# Patient Record
Sex: Female | Born: 1970 | Race: White | Hispanic: No | Marital: Married | Smoking: Never smoker
Health system: Southern US, Community
[De-identification: ages and names within clinical notes are randomized; demographics above are authoritative.]

---

## 2001-06-15 ENCOUNTER — Other Ambulatory Visit: Admission: RE | Admit: 2001-06-15 | Discharge: 2001-06-15 | Payer: Self-pay | Admitting: Obstetrics and Gynecology

## 2002-01-12 ENCOUNTER — Inpatient Hospital Stay (HOSPITAL_COMMUNITY): Admission: AD | Admit: 2002-01-12 | Discharge: 2002-01-12 | Payer: Self-pay | Admitting: Obstetrics and Gynecology

## 2002-01-26 ENCOUNTER — Inpatient Hospital Stay (HOSPITAL_COMMUNITY): Admission: AD | Admit: 2002-01-26 | Discharge: 2002-01-30 | Payer: Self-pay | Admitting: Obstetrics and Gynecology

## 2002-03-09 ENCOUNTER — Other Ambulatory Visit: Admission: RE | Admit: 2002-03-09 | Discharge: 2002-03-09 | Payer: Self-pay | Admitting: Obstetrics and Gynecology

## 2003-05-24 ENCOUNTER — Other Ambulatory Visit: Admission: RE | Admit: 2003-05-24 | Discharge: 2003-05-24 | Payer: Self-pay | Admitting: Obstetrics and Gynecology

## 2003-11-17 ENCOUNTER — Inpatient Hospital Stay (HOSPITAL_COMMUNITY): Admission: RE | Admit: 2003-11-17 | Discharge: 2003-11-19 | Payer: Self-pay | Admitting: Obstetrics and Gynecology

## 2004-01-26 ENCOUNTER — Emergency Department (HOSPITAL_COMMUNITY): Admission: EM | Admit: 2004-01-26 | Discharge: 2004-01-26 | Payer: Self-pay | Admitting: Emergency Medicine

## 2004-10-31 ENCOUNTER — Inpatient Hospital Stay: Payer: Self-pay | Admitting: Psychiatry

## 2010-12-03 ENCOUNTER — Encounter (HOSPITAL_COMMUNITY): Payer: Self-pay | Admitting: *Deleted

## 2010-12-13 ENCOUNTER — Other Ambulatory Visit: Payer: Self-pay | Admitting: Obstetrics and Gynecology

## 2010-12-13 ENCOUNTER — Encounter (HOSPITAL_COMMUNITY): Payer: Self-pay | Admitting: Obstetrics and Gynecology

## 2010-12-13 ENCOUNTER — Encounter (HOSPITAL_COMMUNITY)
Admission: RE | Disposition: A | Discharge status: Home / Self Care | Payer: Self-pay | Source: Ambulatory Visit | Attending: Obstetrics and Gynecology

## 2010-12-13 ENCOUNTER — Ambulatory Visit (HOSPITAL_COMMUNITY)
Admission: RE | Admit: 2010-12-13 | Discharge: 2010-12-13 | Disposition: A | Discharge status: Home / Self Care | Payer: BC Managed Care – PPO | Source: Ambulatory Visit | Attending: Obstetrics and Gynecology | Admitting: Obstetrics and Gynecology

## 2010-12-13 ENCOUNTER — Ambulatory Visit (HOSPITAL_COMMUNITY): Payer: BC Managed Care – PPO | Admitting: Anesthesiology

## 2010-12-13 ENCOUNTER — Encounter (HOSPITAL_COMMUNITY): Payer: Self-pay | Admitting: Anesthesiology

## 2010-12-13 DIAGNOSIS — N938 Other specified abnormal uterine and vaginal bleeding: Secondary | ICD-10-CM | POA: Insufficient documentation

## 2010-12-13 DIAGNOSIS — N949 Unspecified condition associated with female genital organs and menstrual cycle: Secondary | ICD-10-CM | POA: Insufficient documentation

## 2010-12-13 DIAGNOSIS — N939 Abnormal uterine and vaginal bleeding, unspecified: Secondary | ICD-10-CM | POA: Diagnosis present

## 2010-12-13 LAB — CBC
Hemoglobin: 13.8 g/dL (ref 12.0–15.0)
MCH: 30.8 pg (ref 26.0–34.0)
MCHC: 34.4 g/dL (ref 30.0–36.0)
RDW: 13.6 % (ref 11.5–15.5)

## 2010-12-13 SURGERY — DILATATION & CURETTAGE/HYSTEROSCOPY WITH RESECTOCOPE
Anesthesia: General | Site: Vagina | Wound class: Clean Contaminated

## 2010-12-13 MED ORDER — LIDOCAINE HCL (CARDIAC) 20 MG/ML IV SOLN
INTRAVENOUS | Status: DC | PRN
  Administered 2010-12-13: 80 mg via INTRAVENOUS

## 2010-12-13 MED ORDER — FENTANYL CITRATE 0.05 MG/ML IJ SOLN: 25.0000 ug | INTRAMUSCULAR | Status: DC | PRN

## 2010-12-13 MED ORDER — FENTANYL CITRATE 0.05 MG/ML IJ SOLN
INTRAMUSCULAR | Status: AC
  Filled 2010-12-13: qty 2

## 2010-12-13 MED ORDER — KETOROLAC TROMETHAMINE 30 MG/ML IJ SOLN
INTRAMUSCULAR | Status: AC
  Filled 2010-12-13: qty 1

## 2010-12-13 MED ORDER — LIDOCAINE HCL (CARDIAC) 20 MG/ML IV SOLN
INTRAVENOUS | Status: AC
  Filled 2010-12-13: qty 5

## 2010-12-13 MED ORDER — MIDAZOLAM HCL 5 MG/5ML IJ SOLN
INTRAMUSCULAR | Status: DC | PRN
  Administered 2010-12-13: 2 mg via INTRAVENOUS

## 2010-12-13 MED ORDER — KETOROLAC TROMETHAMINE 60 MG/2ML IM SOLN
INTRAMUSCULAR | Status: AC
  Filled 2010-12-13: qty 2

## 2010-12-13 MED ORDER — ALBUTEROL SULFATE HFA 108 (90 BASE) MCG/ACT IN AERS
INHALATION_SPRAY | RESPIRATORY_TRACT | Status: DC | PRN
  Administered 2010-12-13: 2 via RESPIRATORY_TRACT

## 2010-12-13 MED ORDER — CHLOROPROCAINE HCL 1 % IJ SOLN
INTRAMUSCULAR | Status: DC | PRN
  Administered 2010-12-13: 10 mL

## 2010-12-13 MED ORDER — KETOROLAC TROMETHAMINE 60 MG/2ML IM SOLN
INTRAMUSCULAR | Status: DC | PRN
  Administered 2010-12-13: 30 mg via INTRAMUSCULAR

## 2010-12-13 MED ORDER — MIDAZOLAM HCL 2 MG/2ML IJ SOLN
INTRAMUSCULAR | Status: AC
  Filled 2010-12-13: qty 2

## 2010-12-13 MED ORDER — METOCLOPRAMIDE HCL 10 MG PO TABS: 10.0000 mg | ORAL_TABLET | Freq: Once | ORAL | Status: DC | PRN

## 2010-12-13 MED ORDER — LACTATED RINGERS IV SOLN
INTRAVENOUS | Status: DC
  Administered 2010-12-13: 14:00:00 via INTRAVENOUS

## 2010-12-13 MED ORDER — SCOPOLAMINE 1 MG/3DAYS TD PT72: 1.0000 | MEDICATED_PATCH | Freq: Once | TRANSDERMAL | Status: DC | PRN

## 2010-12-13 MED ORDER — ONDANSETRON HCL 4 MG/2ML IJ SOLN: 4.0000 mg | Freq: Once | INTRAMUSCULAR | Status: DC | PRN

## 2010-12-13 MED ORDER — ONDANSETRON HCL 4 MG/2ML IJ SOLN
INTRAMUSCULAR | Status: AC
  Filled 2010-12-13: qty 2

## 2010-12-13 MED ORDER — GLYCINE 1.5 % IR SOLN
Status: DC | PRN
  Administered 2010-12-13: 1

## 2010-12-13 MED ORDER — PROPOFOL 10 MG/ML IV EMUL
INTRAVENOUS | Status: DC | PRN
  Administered 2010-12-13: 200 mg via INTRAVENOUS

## 2010-12-13 MED ORDER — FAMOTIDINE 20 MG PO TABS: 20.0000 mg | ORAL_TABLET | Freq: Once | ORAL | Status: DC | PRN

## 2010-12-13 MED ORDER — MEPERIDINE HCL 25 MG/ML IJ SOLN
6.2500 mg | INTRAMUSCULAR | Status: DC | PRN
Start: 2010-12-13 — End: 2010-12-13

## 2010-12-13 MED ORDER — KETOROLAC TROMETHAMINE 30 MG/ML IJ SOLN: 15.0000 mg | Freq: Once | INTRAMUSCULAR | Status: DC | PRN

## 2010-12-13 MED ORDER — ALBUTEROL SULFATE HFA 108 (90 BASE) MCG/ACT IN AERS
INHALATION_SPRAY | RESPIRATORY_TRACT | Status: AC
Start: 2010-12-13 — End: 2010-12-13
  Filled 2010-12-13: qty 6.7

## 2010-12-13 MED ORDER — ONDANSETRON HCL 4 MG/2ML IJ SOLN
INTRAMUSCULAR | Status: DC | PRN
  Administered 2010-12-13: 4 mg via INTRAVENOUS

## 2010-12-13 MED ORDER — MORPHINE SULFATE 0.5 MG/ML IJ SOLN
INTRAMUSCULAR | Status: AC
  Filled 2010-12-13: qty 10

## 2010-12-13 MED ORDER — PANTOPRAZOLE SODIUM 40 MG PO TBEC: 40.0000 mg | DELAYED_RELEASE_TABLET | Freq: Once | ORAL | Status: DC | PRN

## 2010-12-13 MED ORDER — KETOROLAC TROMETHAMINE 30 MG/ML IJ SOLN
INTRAMUSCULAR | Status: DC | PRN
  Administered 2010-12-13: 30 mg via INTRAVENOUS

## 2010-12-13 MED ORDER — FENTANYL CITRATE 0.05 MG/ML IJ SOLN
INTRAMUSCULAR | Status: DC | PRN
  Administered 2010-12-13: 100 ug via INTRAVENOUS

## 2010-12-13 MED ORDER — PROPOFOL 10 MG/ML IV EMUL
INTRAVENOUS | Status: AC
  Filled 2010-12-13: qty 20

## 2010-12-13 MED ORDER — CITRIC ACID-SODIUM CITRATE 334-500 MG/5ML PO SOLN: 30.0000 mL | Freq: Once | ORAL | Status: DC | PRN

## 2010-12-13 MED ORDER — FENTANYL CITRATE 0.05 MG/ML IJ SOLN
INTRAMUSCULAR | Status: AC
  Filled 2010-12-13: qty 5

## 2010-12-13 MED ORDER — LACTATED RINGERS IV SOLN
INTRAVENOUS | Status: DC | PRN
  Administered 2010-12-13: 15:00:00 via INTRAVENOUS

## 2010-12-13 SURGICAL SUPPLY — 19 items
CANISTER SUCTION 2500CC (MISCELLANEOUS) ×2 IMPLANT
CATH ROBINSON RED A/P 16FR (CATHETERS) ×2 IMPLANT
CLOTH BEACON ORANGE TIMEOUT ST (SAFETY) ×2 IMPLANT
CONTAINER PREFILL 10% NBF 60ML (FORM) ×4 IMPLANT
DRAPE UTILITY XL STRL (DRAPES) ×4 IMPLANT
ELECT REM PT RETURN 9FT ADLT (ELECTROSURGICAL) ×2
ELECTRODE REM PT RTRN 9FT ADLT (ELECTROSURGICAL) ×1 IMPLANT
ELECTRODE ROLLER BARREL 22FR (ELECTROSURGICAL) IMPLANT
ELECTRODE ROLLER VERSAPOINT (ELECTRODE) IMPLANT
ELECTRODE RT ANGLE VERSAPOINT (CUTTING LOOP) IMPLANT
ELECTRODE VAPORCUT 22FR (ELECTROSURGICAL) IMPLANT
GLOVE BIO SURGEON STRL SZ 6.5 (GLOVE) ×2 IMPLANT
GLOVE BIOGEL PI IND STRL 7.0 (GLOVE) ×2 IMPLANT
GLOVE BIOGEL PI INDICATOR 7.0 (GLOVE) ×2
GOWN PREVENTION PLUS LG XLONG (DISPOSABLE) ×4 IMPLANT
LOOP ANGLED CUTTING 22FR (CUTTING LOOP) IMPLANT
PACK HYSTEROSCOPY LF (CUSTOM PROCEDURE TRAY) ×2 IMPLANT
TOWEL OR 17X24 6PK STRL BLUE (TOWEL DISPOSABLE) ×4 IMPLANT
WATER STERILE IRR 1000ML POUR (IV SOLUTION) ×2 IMPLANT

## 2010-12-13 NOTE — Transfer of Care (Signed)
Immediate Anesthesia Transfer of Care Note  Patient: Sarah Dixon  Procedure(s) Performed:  DILATATION & CURETTAGE/HYSTEROSCOPY WITH RESECTOSCOPE - Dilation and Curettage with Hysteroscopy  Patient Location: PACU  Anesthesia Type: General  Level of Consciousness: awake, alert  and oriented  Airway & Oxygen Therapy: Patient Spontanous Breathing and Patient connected to nasal cannula oxygen  Post-op Assessment: Report given to PACU RN  Post vital signs: stable  Complications: No apparent anesthesia complications

## 2010-12-13 NOTE — Anesthesia Preprocedure Evaluation (Addendum)
Anesthesia Evaluation  Name, MR# and DOB Patient awake  General Assessment Comment  Reviewed: Allergy & Precautions, H&P , NPO status , Patient's Chart, lab work & pertinent test results, reviewed documented beta blocker date and time   Airway Mallampati: II TM Distance: >3 FB Neck ROM: full    Dental  (+) Teeth Intact   Pulmonary  asthma (allergy related, no inhaler use x2 months)  clear to auscultation  breath sounds clear to auscultation none    Cardiovascular Exercise Tolerance: Good regular Normal    Neuro/Psych Negative Neurological ROS  Negative Psych ROS  GI/Hepatic/Renal negative GI ROS, negative Liver ROS, and negative Renal ROS (+)       Endo/Other  Negative Endocrine ROS (+)      Abdominal   Musculoskeletal   Hematology negative hematology ROS (+)   Peds  Reproductive/Obstetrics negative OB ROS    Anesthesia Other Findings             Anesthesia Physical Anesthesia Plan  ASA: II  Anesthesia Plan: General   Post-op Pain Management:    Induction:   Airway Management Planned:   Additional Equipment:   Intra-op Plan:   Post-operative Plan:   Informed Consent: I have reviewed the patients History and Physical, chart, labs and discussed the procedure including the risks, benefits and alternatives for the proposed anesthesia with the patient or authorized representative who has indicated his/her understanding and acceptance.   Dental Advisory Given  Plan Discussed with: CRNA and Surgeon  Anesthesia Plan Comments: (ASA 2 secondary to asthma)       Anesthesia Quick Evaluation

## 2010-12-13 NOTE — Anesthesia Postprocedure Evaluation (Signed)
Anesthesia Post Note  Patient: Sarah Dixon  Procedure(s) Performed:  DILATATION & CURETTAGE/HYSTEROSCOPY WITH RESECTOSCOPE - Dilation and Curettage with Hysteroscopy  Anesthesia type: General  Patient location: PACU  Post pain: Pain level controlled  Post assessment: Post-op Vital signs reviewed  Last Vitals:  Filed Vitals:   12/13/10 1359  BP: 110/70  Pulse: 71  Temp: 98.8 F (37.1 C)  Resp: 16    Post vital signs: Reviewed  Level of consciousness: sedated  Complications: No apparent anesthesia complicationsfj

## 2010-12-13 NOTE — Brief Op Note (Signed)
12/13/2010  3:59 PM  PATIENT:  Sarah Dixon  40 y.o. female  PRE-OPERATIVE DIAGNOSIS:  Dysfunctional Uterine Bleeding, Endometrial Mass  POST-OPERATIVE DIAGNOSIS:  Dysfunctional Uterine Bleeding; Endometrial Mass  PROCEDURE:  Procedure(s): DILATATION & CURETTAGE/HYSTEROSCOPY diagnostic  SURGEON:  Surgeon(s): Borden Thune Cathie Beams, MD  PHYSICIAN ASSISTANT:   ASSISTANTS: none  ANESTHESIA:   general and paracervical block  ESTIMATED BLOOD LOSS: * No blood loss amount entered *   BLOOD ADMINISTERED:none  DRAINS: none   LOCAL MEDICATIONS USED:  OTHER nesicaine  SPECIMEN:  Source of Specimen:  endometrial currettings  DISPOSITION OF SPECIMEN:  PATHOLOGY  COUNTS:  YES  TOURNIQUET:  * No tourniquets in log *  DICTATION #: [id number]  PLAN OF CARE: home  PATIENT DISPOSITION:  home   Delay start of Pharmacological VTE agent (>24hrs) due to surgical blood loss or risk of bleeding:  no

## 2010-12-14 NOTE — Op Note (Signed)
Sarah Dixon, Sarah Dixon           ACCOUNT NO.:  [id number]  MEDICAL RECORD NO.:  [id number]  LOCATION:  [facility]                          FACILITY:  [facility]  PHYSICIAN:  Maxie Better, M.D.DATE OF BIRTH:  [birth year]  DATE OF PROCEDURE:  12/13/2010 DATE OF DISCHARGE:  12/13/2010                              OPERATIVE REPORT   PREOPERATIVE DIAGNOSES:  Dysfunctional uterine bleeding, endometrial mass.  POSTOPERATIVE DIAGNOSIS:  Dysfunctional uterine bleeding.  PROCEDURE:  Diagnostic hysteroscopy, dilation, curettage.  ANESTHESIA:  General, paracervical block.  SURGEON:  Maxie Better, MD  ASSISTANT:  None.  PROCEDURE:  Under adequate general anesthesia, the patient was placed in the dorsal lithotomy position.  She was sterilely prepped and draped in usual fashion.  Bladder was catheterized with moderate amount of urine. Examination under anesthesia revealed anteverted uterus.  No adnexal masses could be appreciated.  Bivalve speculum was placed in the vagina, 10 mL of 1% Nesacaine was injected paracervically at 3 and 9 o'clock positions.  The anterior lip of the cervix was grasped with a single- tooth tenaculum.  The cervix was then serially dilated to [facility] dilator.  A glycine primed diagnostic hysteroscope was introduced into the uterine cavity.  Both tubal ostia were seen well.  Posterior thickening was noted, but no defined endometrial polyp initially seen. The hysteroscope was removed.  The cavity was then curetted.  The cystoscope was then reinserted.  A polypoid lesion was noted in the left lateral wall.  The hysteroscope was then removed.  The cavity was again curetted.  Reinsertion of the hysteroscope no other lesions were noted. The endocervical canal was without any lesions.  The procedure was felt to be complete by terminating all instruments from the vagina.  SPECIMENS:  Endometrial curetting sent to Pathology.  ESTIMATED BLOOD LOSS:   Minimal.  COMPLICATION:  None.  FLUID DEFICIT:  65 mL.  The patient tolerated the procedure well and was transferred to recovery room in stable condition.     Maxie Better, M.D      [facility]  D:  12/13/2010  T:  12/14/2010  Job:  [id number]

## 2017-01-28 ENCOUNTER — Encounter: Payer: Self-pay | Admitting: Emergency Medicine

## 2017-01-28 ENCOUNTER — Emergency Department
Admission: EM | Admit: 2017-01-28 | Discharge: 2017-01-29 | Disposition: A | Discharge status: Other Acute Inpatient Hospital | Payer: BLUE CROSS/BLUE SHIELD | Attending: Emergency Medicine | Admitting: Emergency Medicine

## 2017-01-28 ENCOUNTER — Emergency Department: Payer: BLUE CROSS/BLUE SHIELD

## 2017-01-28 DIAGNOSIS — J45909 Unspecified asthma, uncomplicated: Secondary | ICD-10-CM | POA: Diagnosis not present

## 2017-01-28 DIAGNOSIS — T438X2A Poisoning by other psychotropic drugs, intentional self-harm, initial encounter: Secondary | ICD-10-CM | POA: Diagnosis not present

## 2017-01-28 DIAGNOSIS — T50901A Poisoning by unspecified drugs, medicaments and biological substances, accidental (unintentional), initial encounter: Secondary | ICD-10-CM

## 2017-01-28 DIAGNOSIS — F419 Anxiety disorder, unspecified: Secondary | ICD-10-CM | POA: Diagnosis not present

## 2017-01-28 DIAGNOSIS — F3113 Bipolar disorder, current episode manic without psychotic features, severe: Secondary | ICD-10-CM | POA: Diagnosis not present

## 2017-01-28 DIAGNOSIS — T56891A Toxic effect of other metals, accidental (unintentional), initial encounter: Secondary | ICD-10-CM

## 2017-01-28 DIAGNOSIS — R42 Dizziness and giddiness: Secondary | ICD-10-CM | POA: Diagnosis present

## 2017-01-28 DIAGNOSIS — F319 Bipolar disorder, unspecified: Secondary | ICD-10-CM | POA: Diagnosis not present

## 2017-01-28 DIAGNOSIS — F3110 Bipolar disorder, current episode manic without psychotic features, unspecified: Secondary | ICD-10-CM

## 2017-01-28 DIAGNOSIS — T50902A Poisoning by unspecified drugs, medicaments and biological substances, intentional self-harm, initial encounter: Secondary | ICD-10-CM

## 2017-01-28 LAB — URINE DRUG SCREEN, QUALITATIVE (ARMC ONLY)
AMPHETAMINES, UR SCREEN: NOT DETECTED
BENZODIAZEPINE, UR SCRN: NOT DETECTED
Barbiturates, Ur Screen: NOT DETECTED
Cannabinoid 50 Ng, Ur ~~LOC~~: NOT DETECTED
Cocaine Metabolite,Ur ~~LOC~~: NOT DETECTED
MDMA (ECSTASY) UR SCREEN: NOT DETECTED
METHADONE SCREEN, URINE: NOT DETECTED
Opiate, Ur Screen: NOT DETECTED
PHENCYCLIDINE (PCP) UR S: NOT DETECTED
TRICYCLIC, UR SCREEN: NOT DETECTED

## 2017-01-28 LAB — HEPATIC FUNCTION PANEL
ALBUMIN: 4.6 g/dL (ref 3.5–5.0)
ALT: 15 U/L (ref 14–54)
AST: 19 U/L (ref 15–41)
Alkaline Phosphatase: 57 U/L (ref 38–126)
BILIRUBIN INDIRECT: 0.8 mg/dL (ref 0.3–0.9)
Bilirubin, Direct: 0.2 mg/dL (ref 0.1–0.5)
TOTAL PROTEIN: 8.4 g/dL — AB (ref 6.5–8.1)
Total Bilirubin: 1 mg/dL (ref 0.3–1.2)

## 2017-01-28 LAB — BASIC METABOLIC PANEL
Anion gap: 9 (ref 5–15)
BUN: 11 mg/dL (ref 6–20)
CALCIUM: 9.7 mg/dL (ref 8.9–10.3)
CO2: 26 mmol/L (ref 22–32)
CREATININE: 0.7 mg/dL (ref 0.44–1.00)
Chloride: 102 mmol/L (ref 101–111)
Glucose, Bld: 100 mg/dL — ABNORMAL HIGH (ref 65–99)
Potassium: 3.8 mmol/L (ref 3.5–5.1)
Sodium: 137 mmol/L (ref 135–145)

## 2017-01-28 LAB — CBC WITH DIFFERENTIAL/PLATELET
Basophils Absolute: 0 10*3/uL (ref 0–0.1)
Basophils Relative: 0 %
EOS ABS: 0 10*3/uL (ref 0–0.7)
Eosinophils Relative: 0 %
HCT: 47.3 % — ABNORMAL HIGH (ref 35.0–47.0)
Hemoglobin: 16.1 g/dL — ABNORMAL HIGH (ref 12.0–16.0)
LYMPHS ABS: 1.1 10*3/uL (ref 1.0–3.6)
Lymphocytes Relative: 8 %
MCH: 30.4 pg (ref 26.0–34.0)
MCHC: 34 g/dL (ref 32.0–36.0)
MCV: 89.2 fL (ref 80.0–100.0)
MONO ABS: 0.7 10*3/uL (ref 0.2–0.9)
MONOS PCT: 5 %
Neutro Abs: 11.8 10*3/uL — ABNORMAL HIGH (ref 1.4–6.5)
Neutrophils Relative %: 87 %
Platelets: 284 10*3/uL (ref 150–440)
RBC: 5.3 MIL/uL — ABNORMAL HIGH (ref 3.80–5.20)
RDW: 13.3 % (ref 11.5–14.5)
WBC: 13.6 10*3/uL — ABNORMAL HIGH (ref 3.6–11.0)

## 2017-01-28 LAB — ACETAMINOPHEN LEVEL

## 2017-01-28 LAB — LITHIUM LEVEL
LITHIUM LVL: 2.1 mmol/L — AB (ref 0.60–1.20)
Lithium Lvl: 2.7 mmol/L (ref 0.60–1.20)

## 2017-01-28 LAB — POCT PREGNANCY, URINE: PREG TEST UR: NEGATIVE

## 2017-01-28 LAB — LIPASE, BLOOD: LIPASE: 22 U/L (ref 11–51)

## 2017-01-28 LAB — LACTIC ACID, PLASMA: LACTIC ACID, VENOUS: 0.9 mmol/L (ref 0.5–1.9)

## 2017-01-28 LAB — ETHANOL

## 2017-01-28 LAB — TSH: TSH: 2.947 u[IU]/mL (ref 0.350–4.500)

## 2017-01-28 LAB — SALICYLATE LEVEL: Salicylate Lvl: <7 mg/dL (ref 2.8–30.0)

## 2017-01-28 MED ORDER — SODIUM CHLORIDE 0.9 % IV BOLUS (SEPSIS)
1000.0000 mL | Freq: Once | INTRAVENOUS | Status: AC
Start: 2017-01-28 — End: 2017-01-29
  Administered 2017-01-28: 1000 mL via INTRAVENOUS

## 2017-01-28 MED ORDER — ONDANSETRON HCL 4 MG/2ML IJ SOLN
INTRAMUSCULAR | Status: AC
  Filled 2017-01-28: qty 2

## 2017-01-28 MED ORDER — SODIUM CHLORIDE 0.9 % IV BOLUS (SEPSIS)
2000.0000 mL | Freq: Once | INTRAVENOUS | Status: AC
  Administered 2017-01-28: 2000 mL via INTRAVENOUS

## 2017-01-28 MED ORDER — POLYETHYLENE GLYCOL 3350 17 G PO PACK
PACK | ORAL | Status: AC
  Filled 2017-01-28: qty 1

## 2017-01-28 MED ORDER — POLYETHYLENE GLYCOL 3350 17 GM/SCOOP PO POWD
1.0000 | Freq: Once | ORAL | Status: AC
  Administered 2017-01-28: 255 g via ORAL
  Filled 2017-01-28: qty 255

## 2017-01-28 MED ORDER — ONDANSETRON HCL 4 MG/2ML IJ SOLN
4.0000 mg | Freq: Once | INTRAMUSCULAR | Status: AC
  Administered 2017-01-28: 4 mg via INTRAVENOUS

## 2017-01-28 NOTE — ED Notes (Addendum)
Husband called voicing concerns for decreased supervision, requesting implementation 1:1 again, charge to speak with family at this time.

## 2017-01-28 NOTE — ED Notes (Signed)
Lab called about lithium level not ran yet. Reported they will run level now

## 2017-01-28 NOTE — ED Notes (Signed)
Husband took home wedding band and engagement ring

## 2017-01-28 NOTE — ED Notes (Addendum)
Pt moved to ED room 26. Per Scotty Court, MD, safety precaution initiated with ED rover in place with q15 min checks as well as assigned RN, Revonda Standard.

## 2017-01-28 NOTE — ED Provider Notes (Signed)
[facility] Emergency Department Provider Note  ____________________________________________  Time seen: Approximately 4:18 PM  I have reviewed the triage vital signs and the nursing notes.   HISTORY  Chief Complaint Drug Overdose  Level 5 Caveat: Portions of the History and Physical are unable to be obtained due to patient being a poor historian additional history obtained from husband and mother at bedside  HPI Sarah Dixon is a 46 y.o. female brought to the ED by her mother and husband due to a lithium overdose. The patient was started on lithium for anxiety in the setting of her bipolar disorder 3 days ago. She's been taking it as prescribed, 2 times a day up until mid day today, when out of frustration over her inability to sleep, she took the rest of the bottle. She denies SI/HI/hallucinations. Husband states this amounts to 57 tablets of 300 mg lithium carbonate. I'm unable to discern whether this is immediate release or sustained release.ingestion was estimated to take place around 11:00 AM or noon today.patient denies any coingestants.  Patient reports feeling dizzy, tired, and "foggy". Denies any pain, no abdominal pain vomiting or diarrhea. She does have nausea.     Past Medical History:  Diagnosis Date  . Asthma    exercise induced     Patient Active Problem List   Diagnosis Date Noted  . Abnormal vaginal bleeding 12/13/2010     Past Surgical History:  Procedure Laterality Date  . CESAREAN SECTION     x 2     Prior to Admission medications   Medication Sig Start Date End Date Taking? Authorizing Provider  aspirin-acetaminophen-caffeine (EXCEDRIN MIGRAINE) [phone] MG per tablet Take 2 tablets by mouth as needed. For headache pain    Yes [provider]  ibuprofen (ADVIL,MOTRIN) 200 MG tablet Take 600 mg by mouth as needed. For pain    Yes [provider]  lithium 300 MG tablet Take 300 mg by mouth 2 (two)  times daily. 01/26/17  Yes [provider]     Allergies Iodine   No family history on file.  Social History Social History  Substance Use Topics  . Smoking status: Never Smoker  . Smokeless tobacco: Not on file  . Alcohol use 1.2 oz/week    2 Glasses of wine per week    Review of Systems  Constitutional:   No fever or chills.  ENT:   No sore throat. No rhinorrhea. Cardiovascular:   No chest pain or syncope. Respiratory:   No dyspnea or cough. Gastrointestinal:   Negative for abdominal pain, vomiting and diarrhea.  Musculoskeletal:   Negative for focal pain or swelling All other systems reviewed and are negative except as documented above in ROS and HPI.  ____________________________________________   PHYSICAL EXAM:  VITAL SIGNS: ED Triage Vitals [01/28/17 1505]  Enc Vitals Group     BP 139/85     Pulse Rate 74     Resp 18     Temp 98 F (36.7 C)     Temp Source Oral     SpO2 97 %     Weight 170 lb (77.1 kg)     Height  (1.753 m)     Head Circumference      Peak Flow      Pain Score      Pain Loc      Pain Edu?      Excl. in GC?     Vital signs reviewed, nursing assessments reviewed.  Constitutional:   Alert and oriented.not in distress.somewhat drowsy appearing Eyes:   No scleral icterus.  EOMI. No nystagmus. No conjunctival pallor. PERRL. ENT   Head:   Normocephalic and atraumatic.   Nose:   No congestion/rhinnorhea.    Mouth/Throat:   dry mucous membranes, no pharyngeal erythema. No peritonsillar mass.    Neck:   No meningismus. Full ROM Hematological/Lymphatic/Immunilogical:   No cervical lymphadenopathy. Cardiovascular:   RRR. Symmetric bilateral radial and DP pulses.  No murmurs.  Respiratory:   Normal respiratory effort without tachypnea/retractions. Breath sounds are clear and equal bilaterally. No wheezes/rales/rhonchi. Gastrointestinal:   Soft and nontender. Non distended. There is no CVA tenderness.  No rebound,  rigidity, or guarding. Genitourinary:   deferred Musculoskeletal:   Normal range of motion in all extremities. No joint effusions.  No lower extremity tenderness.  No edema. Neurologic:   Normal speech and language.  Motor grossly intact. No gross focal neurologic deficits are appreciated.  Skin:    Skin is warm, dry and intact. No rash noted.  No petechiae, purpura, or bullae.  ____________________________________________    LABS (pertinent positives/negatives) (all labs ordered are listed, but only abnormal results are displayed) Labs Reviewed  ACETAMINOPHEN LEVEL - Abnormal; Notable for the following:       Result Value   Acetaminophen (Tylenol), Serum <10 (*)    All other components within normal limits  CBC WITH DIFFERENTIAL/PLATELET - Abnormal; Notable for the following:    WBC 13.6 (*)    RBC 5.30 (*)    Hemoglobin 16.1 (*)    HCT 47.3 (*)    Neutro Abs 11.8 (*)    All other components within normal limits  BASIC METABOLIC PANEL - Abnormal; Notable for the following:    Glucose, Bld 100 (*)    All other components within normal limits  HEPATIC FUNCTION PANEL - Abnormal; Notable for the following:    Total Protein 8.4 (*)    All other components within normal limits  LITHIUM LEVEL - Abnormal; Notable for the following:    Lithium Lvl 2.70 (*)    All other components within normal limits  ETHANOL  LIPASE, BLOOD  LACTIC ACID, PLASMA  SALICYLATE LEVEL  TSH  URINE DRUG SCREEN, QUALITATIVE ([facility] ONLY)  URINALYSIS, COMPLETE (UACMP) WITH MICROSCOPIC  LITHIUM LEVEL  POC URINE PREG, ED  POCT PREGNANCY, URINE   ____________________________________________   EKG  interpreted by me  Date: 01/28/2017  Rate: 67  Rhythm: normal sinus rhythm  QRS Axis: normal  Intervals: normal  ST/T Wave abnormalities: normal  Conduction Disutrbances: none  Narrative Interpretation: unremarkable      ____________________________________________    RADIOLOGY  Dg Abd 1  View  Result Date: 01/28/2017 CLINICAL DATA:  Overdose. EXAM: ABDOMEN - 1 VIEW COMPARISON:  None. FINDINGS: Paucity of bowel gas. No radio-opaque calculi or other significant radiographic abnormality are seen. IMPRESSION: Negative. Electronically Signed   By: Obie Dredge M.D.   On: 01/28/2017 20:05    ____________________________________________   PROCEDURES Procedures CRITICAL CARE Performed by: Scotty Court, Vendetta Pittinger   Total critical care time: 35 minutes  Critical care time was exclusive of separately billable procedures and treating other patients.  Critical care was necessary to treat or prevent imminent or life-threatening deterioration.  Critical care was time spent personally by me on the following activities: development of treatment plan with patient and/or surrogate as well as nursing, discussions with consultants, evaluation of patient's response to treatment, examination of patient, obtaining history from patient or  surrogate, ordering and performing treatments and interventions, ordering and review of laboratory studies, ordering and review of radiographic studies, pulse oximetry and re-evaluation of patient's condition.  ____________________________________________   INITIAL IMPRESSION / ASSESSMENT AND PLAN / ED COURSE  Pertinent labs & imaging results that were available during my care of the patient were reviewed by me and considered in my medical decision making (see chart for details).  On presentation, pt has good support, appears to be lucid and with MDM capacity and not a danger to herself or others, does not meet criteria for commitment at this time.  Clinical Course as of Jan 28 2134  Wed Jan 28, 2017  1550 Pt p/w lithium overdose of 17 grams PO. Unclear if IR or ER tablets of lithium carbonate. Will give WBI solution at high dose in attempt to minimize any remaining absorption. Check labs. Hydrate aggressively.   [PS]  1921 Lithium level still pending  [PS]   1956 Still waiting for lithium level. According to RN, there was a 2-3 hour delay before lab even started running the test.   [PS]  2131 No severe toxicity sx. Level not indicative of emergent HD. Will check repeat lithium. Family now states they believe pt was trying to kill herself. IVC'd. Psych consult. VSS.  Lithium: (!!) 2.70 [PS]    Clinical Course User Index [PS] Sharman Cheek, MD    ----------------------------------------- 9:35 PM on 01/28/2017 -----------------------------------------  Patient remains medically stable. Serial lithium level to evaluate for any interval absorption. No clinical evidence of significant toxicity other than mild lethargy and slurred speech. Plan for psychiatry evaluation.   ____________________________________________   FINAL CLINICAL IMPRESSION(S) / ED DIAGNOSES  Final diagnoses:  Overdose  Intentional drug overdose, initial encounter [facility])      New Prescriptions   No medications on file     Portions of this note were generated with dragon dictation software. Dictation errors may occur despite best attempts at proofreading.    Sharman Cheek, MD 01/28/17 2136

## 2017-01-28 NOTE — ED Notes (Signed)
Date and time results received: 01/28/17 2217  Test: Lithium Critical Value: 2.10  Name of Provider Notified: dr. Scotty Court

## 2017-01-28 NOTE — ED Notes (Signed)
Hooked patient back up to monitor. 

## 2017-01-28 NOTE — ED Notes (Signed)
Red top, lavendar and light green tube sent to lab.

## 2017-01-28 NOTE — ED Notes (Signed)
Helped patient to go to bathroom.

## 2017-01-28 NOTE — ED Notes (Signed)
Date and time results received: 01/28/17 2046  Test: lithium Critical Value: 2.70  Name of Provider Notified: Dr Scotty Court made aware

## 2017-01-28 NOTE — ED Notes (Signed)
Husband Chrissie Noa) [phone]

## 2017-01-28 NOTE — ED Notes (Signed)
Pharmacy called to send up miralax 255 g

## 2017-01-28 NOTE — ED Triage Notes (Addendum)
Pt presents with husband and mother. Pt reports taking 57 300 mg lithium tablets in an attempt to sleep. Pt husband reports pt started lithium on Monday for treatment of possible bipolar disorder. Pt had not been on medication for bipolar on a daily basis but since lithium has helped in the past this was prescribed for her. Pt family reports pt sleeps only a couple of hours each night. Pt denies SI in triage. Pt alert in triage but appears drowsy. Pt reports taking the pills sometime between 10a and 1p.

## 2017-01-29 ENCOUNTER — Inpatient Hospital Stay
Admission: AD | Admit: 2017-01-29 | Discharge: 2017-02-06 | DRG: 885 | Disposition: A | Discharge status: Home / Self Care | Payer: BLUE CROSS/BLUE SHIELD | Attending: Psychiatry | Admitting: Psychiatry

## 2017-01-29 ENCOUNTER — Encounter: Payer: Self-pay | Admitting: Psychiatry

## 2017-01-29 DIAGNOSIS — Z888 Allergy status to other drugs, medicaments and biological substances status: Secondary | ICD-10-CM | POA: Diagnosis not present

## 2017-01-29 DIAGNOSIS — F319 Bipolar disorder, unspecified: Principal | ICD-10-CM | POA: Diagnosis present

## 2017-01-29 DIAGNOSIS — G4700 Insomnia, unspecified: Secondary | ICD-10-CM | POA: Diagnosis present

## 2017-01-29 DIAGNOSIS — R45851 Suicidal ideations: Secondary | ICD-10-CM | POA: Diagnosis present

## 2017-01-29 DIAGNOSIS — F419 Anxiety disorder, unspecified: Secondary | ICD-10-CM | POA: Diagnosis present

## 2017-01-29 DIAGNOSIS — F3110 Bipolar disorder, current episode manic without psychotic features, unspecified: Secondary | ICD-10-CM

## 2017-01-29 DIAGNOSIS — E538 Deficiency of other specified B group vitamins: Secondary | ICD-10-CM | POA: Diagnosis not present

## 2017-01-29 DIAGNOSIS — T56891A Toxic effect of other metals, accidental (unintentional), initial encounter: Secondary | ICD-10-CM | POA: Diagnosis present

## 2017-01-29 DIAGNOSIS — T43591A Poisoning by other antipsychotics and neuroleptics, accidental (unintentional), initial encounter: Secondary | ICD-10-CM | POA: Diagnosis present

## 2017-01-29 DIAGNOSIS — T438X2A Poisoning by other psychotropic drugs, intentional self-harm, initial encounter: Secondary | ICD-10-CM | POA: Diagnosis not present

## 2017-01-29 DIAGNOSIS — F3113 Bipolar disorder, current episode manic without psychotic features, severe: Secondary | ICD-10-CM

## 2017-01-29 DIAGNOSIS — Z818 Family history of other mental and behavioral disorders: Secondary | ICD-10-CM | POA: Diagnosis not present

## 2017-01-29 DIAGNOSIS — F29 Unspecified psychosis not due to a substance or known physiological condition: Secondary | ICD-10-CM | POA: Diagnosis present

## 2017-01-29 DIAGNOSIS — T56892D Toxic effect of other metals, intentional self-harm, subsequent encounter: Secondary | ICD-10-CM | POA: Diagnosis not present

## 2017-01-29 LAB — URINALYSIS, COMPLETE (UACMP) WITH MICROSCOPIC
Bilirubin Urine: NEGATIVE
Glucose, UA: NEGATIVE mg/dL
Hgb urine dipstick: NEGATIVE
KETONES UR: 80 mg/dL — AB
Leukocytes, UA: NEGATIVE
NITRITE: NEGATIVE
PROTEIN: 100 mg/dL — AB
Specific Gravity, Urine: 1.025 (ref 1.005–1.030)
pH: 8 (ref 5.0–8.0)

## 2017-01-29 LAB — LITHIUM LEVEL: LITHIUM LVL: 1.21 mmol/L — AB (ref 0.60–1.20)

## 2017-01-29 MED ORDER — ALUM & MAG HYDROXIDE-SIMETH 200-200-20 MG/5ML PO SUSP: 30.0000 mL | ORAL | Status: DC | PRN

## 2017-01-29 MED ORDER — ACETAMINOPHEN 325 MG PO TABS: 650.0000 mg | ORAL_TABLET | Freq: Four times a day (QID) | ORAL | Status: DC | PRN

## 2017-01-29 MED ORDER — TEMAZEPAM 15 MG PO CAPS
15.0000 mg | ORAL_CAPSULE | Freq: Every evening | ORAL | Status: DC | PRN
  Administered 2017-01-29 – 2017-02-04 (×6): 15 mg via ORAL
  Filled 2017-01-29 (×6): qty 1

## 2017-01-29 MED ORDER — TEMAZEPAM 7.5 MG PO CAPS: 15.0000 mg | ORAL_CAPSULE | Freq: Every evening | ORAL | Status: DC | PRN

## 2017-01-29 MED ORDER — MAGNESIUM HYDROXIDE 400 MG/5ML PO SUSP: 30.0000 mL | Freq: Every day | ORAL | Status: DC | PRN

## 2017-01-29 NOTE — ED Provider Notes (Signed)
Vitals:   01/29/17 0100 01/29/17 0730  BP: 113/71 106/74  Pulse: 69 84  Resp: 14   Temp:    SpO2: 99% 98%   No acute events reported to me overnight from nurse or MD. Patient medically clear from intentional lithium overdose with no ongoing symptoms and lithium level decreasing.  Patient may be moved to [facility] as needed.  On IVC awaiting psychiatric disposition.  Calm and alert this morning.     Governor Rooks, MD 01/29/17 [phone]

## 2017-01-29 NOTE — Progress Notes (Signed)
Telephone report given to [facility] in [place].  Patient ambulated over to [facility] with RN and officer present.  Patient to be admitted to room 4.

## 2017-01-29 NOTE — Consult Note (Signed)
[facility] Consult   Reason for Consult:  Consult for 46 year old woman who came into the hospital yesterday after an overdose of lithium Referring Physician:  Reita Cliche Patient Identification: Sarah Dixon MRN:  [id number] Principal Diagnosis: Bipolar affective disorder, current episode manic [facility]) Diagnosis:   Patient Active Problem List   Diagnosis Date Noted  . Lithium overdose [T56.891A] 01/29/2017  . Bipolar affective disorder, current episode manic ([place]) [F31.9] 01/29/2017  . Abnormal vaginal bleeding [N93.9] 12/13/2010    Total Time spent with patient: 1 hour  Subjective:   Sarah Dixon is a 46 y.o. female patient admitted with "I just could not sleep".  HPI:  Patient interviewed chart reviewed. This 46 year old woman came into the emergency room yesterday after taking an intentional overdose of her lithium. Patient was confused at the time but is able to give me a better history today. She says that about a week ago she started to notice that she was having trouble sleeping. She was also starting to feel emotionally overwhelmed and very nervous. Was having a hard time focusing and a hard time getting things done. She has been under a lot of stress recently and taken on a lot of extra activities. Patient recognized that this was a similar feeling to what she has had in the past when she has had manic episodes. She went to her primary care doctor who gave her a prescription for lithium. She started taking the lithium as prescribed for several days but was still not able to sleep. Evidently yesterday afternoon out of frustration she reportedly took the entire bottle of lithium possibly as many as 50 or more tablets of 300 mg strength. Patient denies that she was trying to kill her self only that she wanted to sleep. She denies that she's been having hallucinations. Doesn't report any other clear psychotic symptoms. Patient denies any alcohol or drug abuse. Not on  any other medicine.  Social history: Married to teenage children. Says that she takes on a large amount of volunteer activities and has been feeling overwhelmed and stressed recently.  Medical history: Patient's lithium level has obviously gone up high with a peak level so far had about 2.7. She does not have any known ongoing medical problems however and is not on any other medicine at baseline.  Substance abuse history: Denies any regular alcohol abuse or other drug use.  Past Psychiatric History: Patient reports she has had 2 previous episodes of bipolar disorder. She says about 20 years ago and again about 12 years ago she had episodes of mania with lack of sleep agitated mood racing thoughts that were then followed by severe depression. On at least one occasion she had psychotic symptoms. She denies prior suicide attempts. She did have hospitalizations on both of those occasions but records are no longer available. She says that lithium was uniquely effective both of those times.  Risk to Self: Suicidal Ideation: Yes-Currently Present Suicidal Intent: Yes-Currently Present Is patient at risk for suicide?: Yes Suicidal Plan?: No-Not Currently/Within Last 6 Months Access to Means: Yes Specify Access to Suicidal Means: The patient overdosed on Lithium What has been your use of drugs/alcohol within the last 12 months?: none How many times?: 0 Other Self Harm Risks: none Triggers for Past Attempts: None known Intentional Self Injurious Behavior: None Risk to Others: Homicidal Ideation: No Thoughts of Harm to Others: No Current Homicidal Intent: No Current Homicidal Plan: No Access to Homicidal Means: No Identified Victim: NA History of  harm to others?: No Assessment of Violence: None Noted Violent Behavior Description: none Does patient have access to weapons?: No Criminal Charges Pending?: No Does patient have a court date: No Prior Inpatient Therapy: Prior Inpatient Therapy:  Yes Prior Therapy Dates: 2003 Prior Therapy Facilty/Provider(s): [facility] Reason for Treatment: Depression Prior Outpatient Therapy: Prior Outpatient Therapy: No Prior Therapy Dates: NA Prior Therapy Facilty/Provider(s): NA Reason for Treatment: NA Does patient have an ACCT team?: No Does patient have Intensive In-House Services?  : No Does patient have [facility] services? : No Does patient have P4CC services?: No  Past Medical History:  Past Medical History:  Diagnosis Date  . Asthma    exercise induced    Past Surgical History:  Procedure Laterality Date  . CESAREAN SECTION     x 2   Family History: No family history on file. Family Psychiatric  History: Positive family history of bipolar disorder with her mother having bipolar disorder and possibly her grandmother as well Social History:  History  Alcohol Use  . 1.2 oz/week  . 2 Glasses of wine per week     History  Drug Use No    Social History   Social History  . Marital status: Married    Spouse name: N/A  . Number of children: N/A  . Years of education: N/A   Social History Main Topics  . Smoking status: Never Smoker  . Smokeless tobacco: None  . Alcohol use 1.2 oz/week    2 Glasses of wine per week  . Drug use: No  . Sexual activity: Yes    Birth control/ protection: Surgical   Other Topics Concern  . None   Social History Narrative  . None   Additional Social History:    Allergies:   Allergies  Allergen Reactions  . Iodine Other (See Comments)    Childhood reaction- throat swelling and itching    Labs:  Results for orders placed or performed during the hospital encounter of 01/28/17 (from the past 48 hour(s))  Urinalysis, Complete w Microscopic     Status: Abnormal   Collection Time: 01/28/17  3:13 PM  Result Value Ref Range   Color, Urine AMBER (A) YELLOW    Comment: BIOCHEMICALS MAY BE AFFECTED BY COLOR   APPearance CLOUDY (A) CLEAR   Specific Gravity, Urine 1.025 1.005 - 1.030   pH  8.0 5.0 - 8.0   Glucose, UA NEGATIVE NEGATIVE mg/dL   Hgb urine dipstick NEGATIVE NEGATIVE   Bilirubin Urine NEGATIVE NEGATIVE   Ketones, ur 80 (A) NEGATIVE mg/dL   Protein, ur 100 (A) NEGATIVE mg/dL   Nitrite NEGATIVE NEGATIVE   Leukocytes, UA NEGATIVE NEGATIVE   RBC / HPF 0-5 0 - 5 RBC/hpf   WBC, UA 0-5 0 - 5 WBC/hpf   Bacteria, UA RARE (A) NONE SEEN   Squamous Epithelial / LPF 6-30 (A) NONE SEEN   Mucus PRESENT    Amorphous Crystal PRESENT    Triple Phosphate Crystal PRESENT    Uric Acid Crys, UA PRESENT    Crystals PRESENT (A) NEGATIVE  Ethanol     Status: None   Collection Time: 01/28/17  3:40 PM  Result Value Ref Range   Alcohol, Ethyl (B) <10 <10 mg/dL    Comment:        LOWEST DETECTABLE LIMIT FOR SERUM ALCOHOL IS 10 mg/dL FOR MEDICAL PURPOSES ONLY Please note change in reference range.   Lipase, blood     Status: None   Collection Time: 01/28/17  3:40 PM  Result Value Ref Range   Lipase 22 11 - 51 U/L  Acetaminophen level     Status: Abnormal   Collection Time: 01/28/17  3:40 PM  Result Value Ref Range   Acetaminophen (Tylenol), Serum <10 (L) 10 - 30 ug/mL    Comment:        THERAPEUTIC CONCENTRATIONS VARY SIGNIFICANTLY. A RANGE OF 10-30 ug/mL MAY BE AN EFFECTIVE CONCENTRATION FOR MANY PATIENTS. HOWEVER, SOME ARE BEST TREATED AT CONCENTRATIONS OUTSIDE THIS RANGE. ACETAMINOPHEN CONCENTRATIONS >150 ug/mL AT 4 HOURS AFTER INGESTION AND >50 ug/mL AT 12 HOURS AFTER INGESTION ARE OFTEN ASSOCIATED WITH TOXIC REACTIONS.   Salicylate level     Status: None   Collection Time: 01/28/17  3:40 PM  Result Value Ref Range   Salicylate Lvl <2.1 2.8 - 30.0 mg/dL  CBC with Differential     Status: Abnormal   Collection Time: 01/28/17  3:40 PM  Result Value Ref Range   WBC 13.6 (H) 3.6 - 11.0 K/uL   RBC 5.30 (H) 3.80 - 5.20 MIL/uL   Hemoglobin 16.1 (H) 12.0 - 16.0 g/dL   HCT 47.3 (H) 35.0 - 47.0 %   MCV 89.2 80.0 - 100.0 fL   MCH 30.4 26.0 - 34.0 pg   MCHC 34.0  32.0 - 36.0 g/dL   RDW 13.3 11.5 - 14.5 %   Platelets 284 150 - 440 K/uL   Neutrophils Relative % 87 %   Neutro Abs 11.8 (H) 1.4 - 6.5 K/uL   Lymphocytes Relative 8 %   Lymphs Abs 1.1 1.0 - 3.6 K/uL   Monocytes Relative 5 %   Monocytes Absolute 0.7 0.2 - 0.9 K/uL   Eosinophils Relative 0 %   Eosinophils Absolute 0.0 0 - 0.7 K/uL   Basophils Relative 0 %   Basophils Absolute 0.0 0 - 0.1 K/uL  Basic metabolic panel     Status: Abnormal   Collection Time: 01/28/17  3:40 PM  Result Value Ref Range   Sodium 137 135 - 145 mmol/L   Potassium 3.8 3.5 - 5.1 mmol/L   Chloride 102 101 - 111 mmol/L   CO2 26 22 - 32 mmol/L   Glucose, Bld 100 (H) 65 - 99 mg/dL   BUN 11 6 - 20 mg/dL   Creatinine, Ser 0.70 0.44 - 1.00 mg/dL   Calcium 9.7 8.9 - 10.3 mg/dL   GFR calc non Af Amer >60 >60 mL/min   GFR calc Af Amer >60 >60 mL/min    Comment: (NOTE) The eGFR has been calculated using the CKD EPI equation. This calculation has not been validated in all clinical situations. eGFR's persistently <60 mL/min signify possible Chronic Kidney Disease.    Anion gap 9 5 - 15  Hepatic function panel     Status: Abnormal   Collection Time: 01/28/17  3:40 PM  Result Value Ref Range   Total Protein 8.4 (H) 6.5 - 8.1 g/dL   Albumin 4.6 3.5 - 5.0 g/dL   AST 19 15 - 41 U/L   ALT 15 14 - 54 U/L   Alkaline Phosphatase 57 38 - 126 U/L   Total Bilirubin 1.0 0.3 - 1.2 mg/dL   Bilirubin, Direct 0.2 0.1 - 0.5 mg/dL   Indirect Bilirubin 0.8 0.3 - 0.9 mg/dL  TSH     Status: None   Collection Time: 01/28/17  3:40 PM  Result Value Ref Range   TSH 2.947 0.350 - 4.500 uIU/mL    Comment: Performed by a 3rd  Generation assay with a functional sensitivity of <=0.01 uIU/mL.  Lactic acid, plasma     Status: None   Collection Time: 01/28/17  3:55 PM  Result Value Ref Range   Lactic Acid, Venous 0.9 0.5 - 1.9 mmol/L  Urine Drug Screen, Qualitative     Status: None   Collection Time: 01/28/17  3:55 PM  Result Value Ref  Range   Tricyclic, Ur Screen NONE DETECTED NONE DETECTED   Amphetamines, Ur Screen NONE DETECTED NONE DETECTED   MDMA (Ecstasy)Ur Screen NONE DETECTED NONE DETECTED   Cocaine Metabolite,Ur [place] NONE DETECTED NONE DETECTED   Opiate, Ur Screen NONE DETECTED NONE DETECTED   Phencyclidine (PCP) Ur S NONE DETECTED NONE DETECTED   Cannabinoid 50 Ng, Ur [place] NONE DETECTED NONE DETECTED   Barbiturates, Ur Screen NONE DETECTED NONE DETECTED   Benzodiazepine, Ur Scrn NONE DETECTED NONE DETECTED   Methadone Scn, Ur NONE DETECTED NONE DETECTED    Comment: (NOTE) 161  Tricyclics, urine               Cutoff 1000 ng/mL 200  Amphetamines, urine             Cutoff 1000 ng/mL 300  MDMA (Ecstasy), urine           Cutoff 500 ng/mL 400  Cocaine Metabolite, urine       Cutoff 300 ng/mL 500  Opiate, urine                   Cutoff 300 ng/mL 600  Phencyclidine (PCP), urine      Cutoff 25 ng/mL 700  Cannabinoid, urine              Cutoff 50 ng/mL 800  Barbiturates, urine             Cutoff 200 ng/mL 900  Benzodiazepine, urine           Cutoff 200 ng/mL 1000 Methadone, urine                Cutoff 300 ng/mL 1100 1200 The urine drug screen provides only a preliminary, unconfirmed 1300 analytical test result and should not be used for non-medical 1400 purposes. Clinical consideration and professional judgment should 1500 be applied to any positive drug screen result due to possible 1600 interfering substances. A more specific alternate chemical method 1700 must be used in order to obtain a confirmed analytical result.  1800 Gas chromato graphy / mass spectrometry (GC/MS) is the preferred 1900 confirmatory method.   Lithium level     Status: Abnormal   Collection Time: 01/28/17  5:44 PM  Result Value Ref Range   Lithium Lvl 2.70 (HH) 0.60 - 1.20 mmol/L    Comment: CRITICAL RESULT CALLED TO, READ BACK BY AND VERIFIED WITH REBECCA UHORCHUK 01/28/17 @ 2047  Jones   Pregnancy, urine POC     Status: None   Collection  Time: 01/28/17  7:08 PM  Result Value Ref Range   Preg Test, Ur NEGATIVE NEGATIVE    Comment:        THE SENSITIVITY OF THIS METHODOLOGY IS >24 mIU/mL   Lithium level     Status: Abnormal   Collection Time: 01/28/17  9:42 PM  Result Value Ref Range   Lithium Lvl 2.10 (HH) 0.60 - 1.20 mmol/L    Comment: CRITICAL RESULT CALLED TO, READ BACK BY AND VERIFIED WITH REBECCA UHORCHUK 01/28/17 @ 2219  MLK   Lithium level     Status: Abnormal  Collection Time: 01/29/17  1:25 PM  Result Value Ref Range   Lithium Lvl 1.21 (H) 0.60 - 1.20 mmol/L    Current Facility-Administered Medications  Medication Dose Route Frequency Provider Last Rate Last Dose  . temazepam (RESTORIL) capsule 15 mg  15 mg Oral QHS PRN Wrenn Willcox, Madie Reno, MD       Current Outpatient Prescriptions  Medication Sig Dispense Refill  . aspirin-acetaminophen-caffeine (EXCEDRIN MIGRAINE) 250-250-65 MG per tablet Take 2 tablets by mouth as needed. For headache pain     . ibuprofen (ADVIL,MOTRIN) 200 MG tablet Take 600 mg by mouth as needed. For pain     . lithium 300 MG tablet Take 300 mg by mouth 2 (two) times daily.  3    Musculoskeletal: Strength & Muscle Tone: within normal limits Gait & Station: normal Patient leans: N/A  Psychiatric Specialty Exam: Physical Exam  Nursing note and vitals reviewed. Constitutional: She appears well-developed and well-nourished.  HENT:  Head: Normocephalic and atraumatic.  Eyes: Pupils are equal, round, and reactive to light. Conjunctivae are normal.  Neck: Normal range of motion.  Cardiovascular: Regular rhythm.   Respiratory: Effort normal. No respiratory distress.  GI: Soft.  Musculoskeletal: Normal range of motion.  Neurological: She is alert.  Skin: Skin is warm and dry.  Psychiatric: Her affect is blunt. Her speech is delayed. She is slowed and withdrawn. Thought content is not paranoid. She expresses impulsivity. She expresses no homicidal and no suicidal ideation. She  exhibits abnormal recent memory.    Review of Systems  Constitutional: Negative.   HENT: Negative.   Eyes: Negative.   Respiratory: Negative.   Cardiovascular: Negative.   Gastrointestinal: Negative.   Musculoskeletal: Negative.   Skin: Negative.   Neurological: Negative.   Psychiatric/Behavioral: Positive for memory loss. Negative for depression, hallucinations, substance abuse and suicidal ideas. The patient is nervous/anxious and has insomnia.     Blood pressure (!) 107/56, pulse 64, temperature 98.6 F (37 C), temperature source Oral, resp. rate 18, height _0  (1.753 m), weight 77.1 kg (170 lb), SpO2 100 %.Body mass index is 25.1 kg/m.  General Appearance: Casual  Eye Contact:  Good  Speech:  Slow  Volume:  Decreased  Mood:  Dysphoric  Affect:  Constricted  Thought Process:  Disorganized  Orientation:  Full (Time, Place, and Person)  Thought Content:  Rumination and Tangential  Suicidal Thoughts:  No  Homicidal Thoughts:  No  Memory:  Immediate;   Good Recent;   Fair Remote;   Fair  Judgement:  Fair  Insight:  Fair  Psychomotor Activity:  Decreased  Concentration:  Concentration: Fair  Recall:  [employer] of Knowledge:  Fair  Language:  Fair  Akathisia:  No  Handed:  Right  AIMS (if indicated):     Assets:  Desire for Improvement Financial Resources/Insurance Housing Physical Health Resilience Social Support  ADL's:  Intact  Cognition:  Impaired,  Mild  Sleep:        Treatment Plan Summary: Daily contact with patient to assess and evaluate symptoms and progress in treatment, Medication management and Plan 46 year old woman who came into the hospital after taking a very large overdose of lithium carbonate. She has claimed that it was simply to get some sleep. Obviously poor judgment. Lithium level now seems to be trending down but we have ordered another level to come back later this evening. Patient will be admitted to the psychiatric unit. Continue IVC. I  put in an order for temazepam 15  mg at night as needed for sleep as a safe alternative in case she is still unable to sleep tonight. Orders completed. Patient aware of the plan which has been reviewed with TTS nursing and ER physician.  Disposition: Recommend psychiatric Inpatient admission when medically cleared. Supportive therapy provided about ongoing stressors.  Alethia Berthold, MD 01/29/2017 5:37 PM

## 2017-01-29 NOTE — ED Notes (Signed)
Poison Control called for update. Recommends continued hydration and monitoring.

## 2017-01-29 NOTE — ED Notes (Signed)
Patient is alert and oriented. Patient states she felt overwhelmed with a lot of things that had been going on financial, buying a new beach house and trying to get it ready to rent, felt unable to carry out daily activities with the kids. Patient states she has not been sleeping and has had a decrease in appetite. Patient denies SI/HI and A/V/H at this time. Patient provided support and encouragement.  Q 15 minute in progress for safety and patient remains safe on unit and monitoring continues.

## 2017-01-29 NOTE — ED Notes (Signed)
BEHAVIORAL HEALTH ROUNDING  Patient sleeping: No.  Patient alert and oriented: yes  Behavior appropriate: Yes. ; If no, describe:  Nutrition and fluids offered: Yes  Toileting and hygiene offered: Yes  Sitter present: not applicable, Q 15 min safety rounds and observation via security camera. Law enforcement present: Yes ODS  ED BHU PLACEMENT JUSTIFICATION  Is the patient under IVC or is there intent for IVC: No.  Is the patient medically cleared: Yes.  Is there vacancy in the ED BHU: Yes.  Is the population mix appropriate for patient: Yes.  Is the patient awaiting placement in inpatient or outpatient setting: Yes.  Has the patient had a psychiatric consult: Yes.  Survey of unit performed for contraband, proper placement and condition of furniture, tampering with fixtures in bathroom, shower, and each patient room: Yes. ; Findings: All clear  APPEARANCE/BEHAVIOR  calm, cooperative and adequate rapport can be established  NEURO ASSESSMENT  Orientation: time, place and person  Hallucinations: No.None noted (Hallucinations)  Speech: Normal  Gait: normal  RESPIRATORY ASSESSMENT  WNL  CARDIOVASCULAR ASSESSMENT  WNL  GASTROINTESTINAL ASSESSMENT  WNL  EXTREMITIES  WNL  PLAN OF CARE  Provide calm/safe environment. Vital signs assessed TID. ED BHU Assessment once each 12-hour shift. Collaborate with TTS daily or as condition indicates. Assure the ED provider has rounded once each shift. Provide and encourage hygiene. Provide redirection as needed. Assess for escalating behavior; address immediately and inform ED provider.  Assess family dynamic and appropriateness for visitation as needed: Yes. ; If necessary, describe findings:  Educate the patient/family about BHU procedures/visitation: Yes. ; If necessary, describe findings: Pt is calm and cooperative at this time. Pt understanding and accepting of unit procedures/rules. Will continue to monitor with Q 15 min safety rounds and  observation via security camera.

## 2017-01-29 NOTE — ED Notes (Signed)
BEHAVIORAL HEALTH ROUNDING  Patient sleeping: No.  Patient alert and oriented: yes  Behavior appropriate: Yes. ; If no, describe:  Nutrition and fluids offered: Yes  Toileting and hygiene offered: Yes  Sitter present: not applicable, Q 15 min safety rounds and observation via security camera. Law enforcement present: Yes ODS  

## 2017-01-29 NOTE — ED Notes (Signed)
Patient is resting comfortably at this time with no signs of distress present. Equal, unlabored respirations. Will continue to monitor.

## 2017-01-29 NOTE — BH Assessment (Signed)
Assessment Note  Sarah Dixon is an 46 y.o. female. The patient came in after taking about 57 tablets of 300 mg of lithium in a suicide attempt.  She initially stated she was trying to get some sleep.  She started the medication Monday.  The patient reported her biggest stressor is financial.  She recently purchased another home and this has caused more financial stress.  The patient was last hospitalized 12 years ago for depression.  She reported she is not sleeping well at night and not eating well.  She does not currently have a counselor or psychiatrist.  She is getting her medication from her PCP.  It is unclear if the patient is coming out of a manic/hypomanic episode.  She reported she kept busy with doing favors for others and volunteering for various organizations.  The patient stated she started having trouble sleeping about 2 weeks ago.  The patient denied SA, HI and psychosis.  Diagnosis: Bipolar 1 Disorder  Past Medical History:  Past Medical History:  Diagnosis Date  . Asthma    exercise induced    Past Surgical History:  Procedure Laterality Date  . CESAREAN SECTION     x 2    Family History: No family history on file.  Social History:  reports that she has never smoked. She does not have any smokeless tobacco history on file. She reports that she drinks about 1.2 oz of alcohol per week . She reports that she does not use drugs.  Additional Social History:  Alcohol / Drug Use Pain Medications: See PTA Prescriptions: See PTA Over the Counter: See PTA History of alcohol / drug use?: No history of alcohol / drug abuse  CIWA: CIWA-Ar BP: 113/71 Pulse Rate: 69 COWS:    Allergies:  Allergies  Allergen Reactions  . Iodine Other (See Comments)    Childhood reaction- throat swelling and itching    Home Medications:  (Not in a hospital admission)  OB/GYN Status:  No LMP recorded.  General Assessment Data Location of Assessment: [facility] ED TTS Assessment:  In system Is this a Tele or Face-to-Face Assessment?: Face-to-Face Is this an Initial Assessment or a Re-assessment for this encounter?: Initial Assessment Marital status: Married Is patient pregnant?: No Pregnancy Status: No Living Arrangements: Spouse/significant other, Children Can pt return to current living arrangement?: Yes Admission Status: Voluntary Is patient capable of signing voluntary admission?: Yes Referral Source: Self/Family/Friend Insurance type: BCBS     Crisis Care Plan Living Arrangements: Spouse/significant other, Children Legal Guardian: Other: (Self) Name of Psychiatrist: none Name of Therapist: none  Education Status Is patient currently in school?: No Current Grade: NA Highest grade of school patient has completed: 2 years of college Name of school: NA Contact person: NA  Risk to self with the past 6 months Suicidal Ideation: Yes-Currently Present Has patient been a risk to self within the past 6 months prior to admission? : Yes Suicidal Intent: Yes-Currently Present Has patient had any suicidal intent within the past 6 months prior to admission? : Yes Is patient at risk for suicide?: Yes Suicidal Plan?: No-Not Currently/Within Last 6 Months Has patient had any suicidal plan within the past 6 months prior to admission? : Yes Access to Means: Yes Specify Access to Suicidal Means: The patient overdosed on Lithium What has been your use of drugs/alcohol within the last 12 months?: none Previous Attempts/Gestures: No How many times?: 0 Other Self Harm Risks: none Triggers for Past Attempts: None known Intentional Self Injurious  Behavior: None Family Suicide History: No Recent stressful life event(s): Recent negative physical changes (coming off of manic phase) Persecutory voices/beliefs?: No Depression: Yes Depression Symptoms: Insomnia, Feeling worthless/self pity, Loss of interest in usual pleasures Substance abuse history and/or treatment for  substance abuse?: No Suicide prevention information given to non-admitted patients: Not applicable  Risk to Others within the past 6 months Homicidal Ideation: No Does patient have any lifetime risk of violence toward others beyond the six months prior to admission? : No Thoughts of Harm to Others: No Current Homicidal Intent: No Current Homicidal Plan: No Access to Homicidal Means: No Identified Victim: NA History of harm to others?: No Assessment of Violence: None Noted Violent Behavior Description: none Does patient have access to weapons?: No Criminal Charges Pending?: No Does patient have a court date: No Is patient on probation?: No  Psychosis Hallucinations: None noted Delusions: None noted  Mental Status Report Appearance/Hygiene: Unremarkable, In hospital gown Eye Contact: Good Motor Activity: Unremarkable, Freedom of movement Speech: Logical/coherent Level of Consciousness: Alert Mood: Depressed Affect: Depressed Anxiety Level: Minimal Thought Processes: Coherent, Relevant Judgement: Partial Orientation: Person, Place, Time, Situation, Appropriate for developmental age Obsessive Compulsive Thoughts/Behaviors: Minimal  Cognitive Functioning Concentration: Decreased Memory: Recent Intact, Remote Intact IQ: Average Insight: Poor Impulse Control: Poor Appetite: Poor Weight Loss: 0 Weight Gain: 0 Sleep: Decreased Total Hours of Sleep: 4 Vegetative Symptoms: None  ADLScreening [facility] Assessment Services) Patient's cognitive ability adequate to safely complete daily activities?: Yes Patient able to express need for assistance with ADLs?: Yes Independently performs ADLs?: Yes (appropriate for developmental age)  Prior Inpatient Therapy Prior Inpatient Therapy: Yes Prior Therapy Dates: 2003 Prior Therapy Facilty/Provider(s): [facility] Reason for Treatment: Depression  Prior Outpatient Therapy Prior Outpatient Therapy: No Prior Therapy Dates: NA Prior Therapy  Facilty/Provider(s): NA Reason for Treatment: NA Does patient have an ACCT team?: No Does patient have Intensive In-House Services?  : No Does patient have [facility] services? : No Does patient have P4CC services?: No  ADL Screening (condition at time of admission) Patient's cognitive ability adequate to safely complete daily activities?: Yes Is the patient deaf or have difficulty hearing?: No Does the patient have difficulty seeing, even when wearing glasses/contacts?: No Does the patient have difficulty concentrating, remembering, or making decisions?: No Patient able to express need for assistance with ADLs?: Yes Does the patient have difficulty dressing or bathing?: No Independently performs ADLs?: Yes (appropriate for developmental age) Does the patient have difficulty walking or climbing stairs?: No Weakness of Legs: None Weakness of Arms/Hands: None  Home Assistive Devices/Equipment Home Assistive Devices/Equipment: None  Therapy Consults (therapy consults require a physician order) PT Evaluation Needed: No OT Evalulation Needed: No SLP Evaluation Needed: No Abuse/Neglect Assessment (Assessment to be complete while patient is alone) Physical Abuse: Denies Verbal Abuse: Denies Sexual Abuse: Denies Exploitation of patient/patient's resources: Denies Self-Neglect: Denies Values / Beliefs Cultural Requests During Hospitalization: None Spiritual Requests During Hospitalization: None Consults Spiritual Care Consult Needed: No Social Work Consult Needed: No Merchant navy officer (For Healthcare) Does Patient Have a Medical Advance Directive?: No    Additional Information 1:1 In Past 12 Months?: No CIRT Risk: No Elopement Risk: No Does patient have medical clearance?: Yes     Disposition:  Disposition Initial Assessment Completed for this Encounter: Yes Disposition of Patient: Referred to (psychiatrist)  On Site Evaluation by:   Reviewed with Physician:    Ottis Stain 01/29/2017 1:56 AM

## 2017-01-29 NOTE — ED Notes (Signed)
ENVIRONMENTAL ASSESSMENT  Potentially harmful objects out of patient reach: Yes.  Personal belongings secured: Yes.  Patient dressed in hospital provided attire only: Yes.  Plastic bags out of patient reach: Yes.  Patient care equipment (cords, cables, call bells, lines, and drains) shortened, removed, or accounted for: Yes.  Equipment and supplies removed from bottom of stretcher: Yes.  Potentially toxic materials out of patient reach: Yes.  Sharps container removed or out of patient reach: Yes.   BEHAVIORAL HEALTH ROUNDING Patient sleeping: Yes.   Patient alert and oriented: not applicable SLEEPING Behavior appropriate: Yes.  ; If no, describe: SLEEPING Nutrition and fluids offered: No SLEEPING Toileting and hygiene offered: NoSLEEPING Sitter present: not applicable, Q 15 min safety rounds and observation via security camera. Law enforcement present: Yes ODS  

## 2017-01-29 NOTE — ED Notes (Signed)
2 unsuccessful IV attempts by this RN 

## 2017-01-29 NOTE — Progress Notes (Addendum)
Spoke with patient about consent for release of information. Patient asked to choose one person for staff to communicate information to and that individual would share information with her family. Patient states she wants information given to her spouse who would communicate with her family after talking with her or staff. Patient signed consent for husband to receive information; consent placed on chart. Patient informed that her mother called for information and was not given any because of no consent to release information to mother was signed. Patient given phone and she called her husband and updated him on her visit with Dr. Toni Amend and that she would be admitted to hospital. Patient without questions or concerns at this time.

## 2017-01-29 NOTE — Progress Notes (Signed)
Roshonna RN with poison control called to follow-up on patient. Left her number [phone] and requested a lithium level be repeated on patient. This Clinical research associate will ask EDP for this lab to be repeated per poison control request..

## 2017-01-29 NOTE — BH Assessment (Signed)
Patient has been accepted for admission to [facility] BMU. This Clinical research associate spoke with Gerarda Gunther Charge Nurse to inquire about when patient can transfer to the unit.  Informed by Guido Sander that patient can come either before 10 pm or after 10.  TTS counselor called [facility] and informed Dewayne Hatch, [facility] RN of patient's acceptance to [facility] BMU

## 2017-01-29 NOTE — Progress Notes (Signed)
Spoke with Dr. Shaune Pollack and patient is medically cleared and can be moved to [facility] or [facility] until seen by psychiatrist.

## 2017-01-29 NOTE — ED Notes (Signed)
Breakfast tray given to pt 

## 2017-01-29 NOTE — ED Notes (Signed)
Husband here for a visit. Per day shift RN he has had prior approval for visit out side of visiting hours tonight. Pt brought to the unit and wanded for safety by ODS. Pt instructed on visiting process of staying in the dayroom and that for his safety he could be asked to leave if the unit environment became unsafe. Pt understanding.

## 2017-01-30 ENCOUNTER — Encounter: Payer: Self-pay | Admitting: Psychiatry

## 2017-01-30 DIAGNOSIS — F3113 Bipolar disorder, current episode manic without psychotic features, severe: Secondary | ICD-10-CM

## 2017-01-30 DIAGNOSIS — T56892D Toxic effect of other metals, intentional self-harm, subsequent encounter: Secondary | ICD-10-CM

## 2017-01-30 LAB — LIPID PANEL
CHOL/HDL RATIO: 3.4 ratio
CHOLESTEROL: 169 mg/dL (ref 0–200)
HDL: 50 mg/dL (ref >40)
LDL Cholesterol: 104 mg/dL — ABNORMAL HIGH (ref 0–99)
Triglycerides: 73 mg/dL (ref <150)
VLDL: 15 mg/dL (ref 0–40)

## 2017-01-30 LAB — HEMOGLOBIN A1C
Hgb A1c MFr Bld: 5.3 % (ref 4.8–5.6)
MEAN PLASMA GLUCOSE: 105.41 mg/dL

## 2017-01-30 LAB — LITHIUM LEVEL: LITHIUM LVL: 0.7 mmol/L (ref 0.60–1.20)

## 2017-01-30 LAB — TSH: TSH: 4.483 u[IU]/mL (ref 0.350–4.500)

## 2017-01-30 MED ORDER — LITHIUM CARBONATE ER 300 MG PO TBCR
300.0000 mg | EXTENDED_RELEASE_TABLET | Freq: Two times a day (BID) | ORAL | Status: DC
  Administered 2017-01-30 – 2017-01-31 (×3): 300 mg via ORAL
  Filled 2017-01-30 (×3): qty 1

## 2017-01-30 MED ORDER — QUETIAPINE FUMARATE 100 MG PO TABS
100.0000 mg | ORAL_TABLET | Freq: Every day | ORAL | Status: DC
  Administered 2017-01-30 – 2017-01-31 (×2): 100 mg via ORAL
  Filled 2017-01-30 (×2): qty 1

## 2017-01-30 NOTE — [facility] Suicide Risk Assessment (Signed)
[facility] INPATIENT:  Family/Significant Other Suicide Prevention Education  Suicide Prevention Education:  Education Completed; [name], husband, [phone] ,  (name of family member/significant other) has been identified by the patient as the family member/significant other with whom the patient will be residing, and identified as the person(s) who will aid the patient in the event of a mental health crisis (suicidal ideations/suicide attempt).  With written consent from the patient, the family member/significant other has been provided the following suicide prevention education, prior to the and/or following the discharge of the patient.  The suicide prevention education provided includes the following:  Suicide risk factors  Suicide prevention and interventions  National Suicide Hotline telephone number  [facility] assessment telephone number  [facility] Emergency Assistance 911  [facility] and/or Residential Mobile Crisis Unit telephone number  Request made of family/significant other to:  Remove weapons (e.g., guns, rifles, knives), all items previously/currently identified as safety concern.  No guns in the home, per husband.  Remove drugs/medications (over-the-counter, prescriptions, illicit drugs), all items previously/currently identified as a safety concern. Husband already planning to secure all medicaitons.  The family member/significant other verbalizes understanding of the suicide prevention education information provided.  The family member/significant other agrees to remove the items of safety concern listed above.  Husband under no illusions that this was a suicide attempt.  Two prior episodes.  He knows pt is solely focused on going home but plans to visit her tonight and discuss with her that she needs treatment until she is better.  Sarah Frederick, LCSW 01/30/2017, 4:03 PM

## 2017-01-30 NOTE — [facility] Group Notes (Signed)
[facility] Group Notes:  (Nursing/MHT/Case Management/Adjunct)  Date:  01/30/2017  Time:  10:11 PM  Type of Therapy:  Psychoeducational Skills  Participation Level:  Active  Participation Quality:  Appropriate, Attentive and Sharing  Affect:  Appropriate  Cognitive:  Appropriate  Insight:  Appropriate and Good  Engagement in Group:  Engaged  Modes of Intervention:  Discussion, Socialization and Support  Summary of Progress/Problems:  Sarah Dixon 01/30/2017, 10:11 PM

## 2017-01-30 NOTE — Progress Notes (Signed)
Pleasant and cooperative. Denies SI.  Affect sad.  First am isolative to room.  Verbalized that she is tired.  Patient up to dayroom playing cards with peers.  Attending groups.  Medication compliant.  Support and encouragement offered.  Safety rounds maintained.

## 2017-01-30 NOTE — B[facility] Counselor (Signed)
Adult Comprehensive Assessment  Patient ID: Sarah Dixon, female   DOB: Aug 19, 1970, 46 y.o.   MRN: [id number]  Information Source: Information source: Patient  Current Stressors:  Employment / Job issues: Pt reports stress related to being overcommitted to a number of volunteer situations. Financial / Lack of resources (include bankruptcy): PT and her husband recently bought a beach house-some financial stress.  Living/Environment/Situation:  Living Arrangements: Spouse/significant other, Children Living conditions (as described by patient or guardian): very wonderful-2 amazing kids How long has patient lived in current situation?: 12 years What is atmosphere in current home: Comfortable  Family History:  Marital status: Married Number of Years Married: 19 What types of issues is patient dealing with in the relationship?: No marital stress Are you sexually active?: Yes What is your sexual orientation?: heterosexual Has your sexual activity been affected by drugs, alcohol, medication, or emotional stress?: no Does patient have children?: Yes How many children?: 2 How is patient's relationship with their children?: son, 84, daughter, 31.  Good relationships.  Childhood History:  By whom was/is the patient raised?: Both parents Additional childhood history information: Good childhood, lots of outdoor activities.  Parents divorced when pt was in 4th grade. Description of patient's relationship with caregiver when they were a child: Mom: good, Dad, good. Patient's description of current relationship with people who raised him/her: Postitive relationships with both. How were you disciplined when you got in trouble as a child/adolescent?: Appropriate discipline Does patient have siblings?: Yes Number of Siblings: 3 Description of patient's current relationship with siblings: 1 brother, 2 sisters: very good relationships.  All in [place]. Did patient suffer any  verbal/emotional/physical/sexual abuse as a child?: No Did patient suffer from severe childhood neglect?: No Has patient ever been sexually abused/assaulted/raped as an adolescent or adult?: No Was the patient ever a victim of a crime or a disaster?: No Witnessed domestic violence?: No Has patient been effected by domestic violence as an adult?: No  Education:  Highest grade of school patient has completed: Associates degree Currently a student?: No Learning disability?: No  Employment/Work Situation:   Employment situation: Employed Where is patient currently employed?: part time at [employer] hours.  Pt does a lot of volunteer work at school, work, Educational psychologist job has been impacted by current illness: Yes Describe how patient's job has been impacted: Pt is stressed out and feels overcommitted. 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 If attempted suicide, did drugs/alcohol play a role in this?: No Alcohol/Substance Abuse Treatment Hx: Denies past history Has alcohol/substance abuse ever caused legal problems?: No  Social Support System:   Patient's Community Support System: Good Describe Community Support System: husband, parents, family Type of faith/religion: [employer]: [employer] How does patient's faith help to cope with current illness?: Through prayer, good music helps deal with stress.  Leisure/Recreation:   Leisure and Hobbies: Outdoor activities, travel  Strengths/Needs:   What things does the patient  do well?: Family doing well. In what areas does patient struggle / problems for patient: Current  depression, overwhelmed.  Discharge Plan:   Does patient have access to transportation?: Yes Will patient be returning to same living situation after discharge?: Yes Currently receiving community mental health services: No If no, would patient like referral for services when discharged?: Yes (What county?) (Dr Sarah Dixon) Does patient have financial barriers related to discharge medications?: No  Summary/Recommendations:   Summary and Recommendations (to be completed by the evaluator): Pt is 46 year old female from [place]. [facility])  Pt is diagnosed with bipolar disorder and admitted due to increased depression and an overdose.  Recommendations for pt include crisis stabilization, therapeutic milieu, attend and participate in groups, medication management, and development of comprehensive mental wellness plan. Upon discharge, pt would like to referred to Dr. Maryruth Dixon.  Sarah Dixon. 01/30/2017

## 2017-01-30 NOTE — [facility] Group Notes (Signed)
[facility] LCSW Group Therapy Note  Date/Time: 01/30/17, 0930  Type of Therapy and Topic:  Group Therapy:  Feelings around Relapse and Recovery  Participation Level:  Did Not Attend   Mood:  Description of Group:    Patients in this group will discuss emotions they experience before and after a relapse. They will process how experiencing these feelings, or avoidance of experiencing them, relates to having a relapse. Facilitator will guide patients to explore emotions they have related to recovery. Patients will be encouraged to process which emotions are more powerful. They will be guided to discuss the emotional reaction significant others in their lives may have to patients' relapse or recovery. Patients will be assisted in exploring ways to respond to the emotions of others without this contributing to a relapse.  Therapeutic Goals: 1. Patient will identify two or more emotions that lead to relapse for them:  2. Patient will identify two emotions that result when they relapse:  3. Patient will identify two emotions related to recovery:  4. Patient will demonstrate ability to communicate their needs through discussion and/or role plays.   Summary of Patient Progress:     Therapeutic Modalities:   Cognitive Behavioral Therapy Solution-Focused Therapy Assertiveness Training Relapse Prevention Therapy  Greg Shyane Fossum, LCSW       

## 2017-01-30 NOTE — Tx Team (Signed)
Interdisciplinary Treatment and Diagnostic Plan Update  01/30/2017 Time of Session: [place] MRN: [id number]  Principal Diagnosis: Bipolar affective disorder, current episode manic ([place])  Secondary Diagnoses: Principal Problem:   Bipolar affective disorder, current episode manic ([place]) Active Problems:   Lithium overdose   Current Medications:  Current Facility-Administered Medications  Medication Dose Route Frequency Provider Last Rate Last Dose  . acetaminophen (TYLENOL) tablet 650 mg  650 mg Oral Q6H PRN [name], [name], MD      . alum & mag hydroxide-simeth (MAALOX/MYLANTA) 200-200-20 MG/5ML suspension 30 mL  30 mL Oral Q4H PRN [name], [name], MD      . lithium carbonate (LITHOBID) CR tablet 300 mg  300 mg Oral Q12H Pucilowska, Jolanta B, MD   300 mg at 01/30/17 1205  . magnesium hydroxide (MILK OF MAGNESIA) suspension 30 mL  30 mL Oral Daily PRN [name], [name], MD      . QUEtiapine (SEROQUEL) tablet 100 mg  100 mg Oral QHS Pucilowska, Jolanta B, MD      . temazepam (RESTORIL) capsule 15 mg  15 mg Oral QHS PRN [name], Madie Reno, MD   15 mg at 01/29/17 2335   PTA Medications: Prescriptions Prior to Admission  Medication Sig Dispense Refill Last Dose  . aspirin-acetaminophen-caffeine (EXCEDRIN MIGRAINE) 250-250-65 MG per tablet Take 2 tablets by mouth as needed. For headache pain    prn at prn  . ibuprofen (ADVIL,MOTRIN) 200 MG tablet Take 600 mg by mouth as needed. For pain    prn at prn  . lithium 300 MG tablet Take 300 mg by mouth 2 (two) times daily.  3 Unknown at Unknown    Patient Stressors: Occupational concerns Other: lack of sleep  Patient Strengths: Ability for insight Average or above average intelligence Motivation for treatment/growth Supportive family/friends  Treatment Modalities: Medication Management, Group therapy, Case management,  1 to 1 session with clinician, Psychoeducation, Recreational therapy.   Physician Treatment Plan for Primary  Diagnosis: Bipolar affective disorder, current episode manic ([place]) Long Term Goal(s): Improvement in symptoms so as ready for discharge NA   Short Term Goals: Ability to identify changes in lifestyle to reduce recurrence of condition will improve Ability to verbalize feelings will improve Ability to disclose and discuss suicidal ideas Ability to demonstrate self-control will improve Ability to identify and develop effective coping behaviors will improve Ability to maintain clinical measurements within normal limits will improve Compliance with prescribed medications will improve Ability to identify triggers associated with substance abuse/mental health issues will improve Ability to identify changes in lifestyle to reduce recurrence of condition will improve Ability to demonstrate self-control will improve Ability to identify triggers associated with substance abuse/mental health issues will improve  Medication Management: Evaluate patient's response, side effects, and tolerance of medication regimen.  Therapeutic Interventions: 1 to 1 sessions, Unit Group sessions and Medication administration.  Evaluation of Outcomes: Not Met  Physician Treatment Plan for Secondary Diagnosis: Principal Problem:   Bipolar affective disorder, current episode manic ([place]) Active Problems:   Lithium overdose  Long Term Goal(s): Improvement in symptoms so as ready for discharge NA   Short Term Goals: Ability to identify changes in lifestyle to reduce recurrence of condition will improve Ability to verbalize feelings will improve Ability to disclose and discuss suicidal ideas Ability to demonstrate self-control will improve Ability to identify and develop effective coping behaviors will improve Ability to maintain clinical measurements within normal limits will improve Compliance with prescribed medications will improve Ability  to identify triggers associated with substance abuse/mental health issues  will improve Ability to identify changes in lifestyle to reduce recurrence of condition will improve Ability to demonstrate self-control will improve Ability to identify triggers associated with substance abuse/mental health issues will improve     Medication Management: Evaluate patient's response, side effects, and tolerance of medication regimen.  Therapeutic Interventions: 1 to 1 sessions, Unit Group sessions and Medication administration.  Evaluation of Outcomes: Not Met   RN Treatment Plan for Primary Diagnosis: Bipolar affective disorder, current episode manic ([place]) Long Term Goal(s): Knowledge of disease and therapeutic regimen to maintain health will improve  Short Term Goals: Ability to verbalize feelings will improve, Ability to identify and develop effective coping behaviors will improve and Compliance with prescribed medications will improve  Medication Management: RN will administer medications as ordered by provider, will assess and evaluate patient's response and provide education to patient for prescribed medication. RN will report any adverse and/or side effects to prescribing provider.  Therapeutic Interventions: 1 on 1 counseling sessions, Psychoeducation, Medication administration, Evaluate responses to treatment, Monitor vital signs and CBGs as ordered, Perform/monitor CIWA, COWS, AIMS and Fall Risk screenings as ordered, Perform wound care treatments as ordered.  Evaluation of Outcomes: Not Met   LCSW Treatment Plan for Primary Diagnosis: Bipolar affective disorder, current episode manic ([place]) Long Term Goal(s): Safe transition to appropriate next level of care at discharge, Engage patient in therapeutic group addressing interpersonal concerns.  Short Term Goals: Engage patient in aftercare planning with referrals and resources, Increase social support and Increase skills for wellness and recovery  Therapeutic Interventions: Assess for all discharge needs, 1 to 1  time with Social worker, Explore available resources and support systems, Assess for adequacy in community support network, Educate family and significant other(s) on suicide prevention, Complete Psychosocial Assessment, Interpersonal group therapy.  Evaluation of Outcomes: Not Met   Progress in Treatment: Attending groups: No. Participating in groups: No. Taking medication as prescribed: Yes. Toleration medication: Yes. Family/Significant other contact made: No, will contact:  husband Patient understands diagnosis: Yes. Discussing patient identified problems/goals with staff: Yes. Medical problems stabilized or resolved: Yes. Denies suicidal/homicidal ideation: Yes. Issues/concerns per patient self-inventory: No. Other: none  New problem(s) identified: No, Describe:  none  New Short Term/Long Term Goal(s):Pt goal is to "go home quickly."  Discharge Plan or Barriers: Pt has been referred to Dr Nicolasa Ducking for outpt follow up.  Reason for Continuation of Hospitalization: Depression Medication stabilization  Estimated Length of Stay:3-5 days.  Attendees: Patient:Sarah Dixon 01/30/2017   Physician: Dr. Bary Leriche, MD 01/30/2017   Nursing: Elige Radon, RN 01/30/2017   RN Care Manager: 01/30/2017   Social Worker: Lurline Idol, LCSW 01/30/2017   Recreational Therapist:  01/30/2017   Other:  01/30/2017   Other:  01/30/2017  Other: 01/30/2017       Scribe for Treatment Team: Joanne Chars, [place] 01/30/2017 1:03 PM

## 2017-01-30 NOTE — H&P (Signed)
Psychiatric Admission Assessment Adult  Patient Identification: Sarah Dixon MRN:  [id number] Date of Evaluation:  01/30/2017 Chief Complaint:  Bipolar 1 Disorder Principal Diagnosis: Bipolar affective disorder, current episode manic ([place]) Diagnosis:   Patient Active Problem List   Diagnosis Date Noted  . Lithium overdose [T56.891A] 01/29/2017  . Bipolar affective disorder, current episode manic ([place]) [F31.9] 01/29/2017  . Abnormal vaginal bleeding [N93.9] 12/13/2010   History of Present Illness:   Identifying data. Sarah Dixon is a 46 year old female with a history of bipolar disorder.  Chief complaint. "I was overwhelmed."  History of present illness. Information was obtained from the patient and the chart. The patient came to the emergency room after overdose on lithium. The patient reports that she felt overwhelmed and suffered severe insomnia. She took a whole bottle of lithium pills in order to go to sleep. She seems somewhat confused about her intentions but did not deny that it was suicide attempt. She has been insomniac, more anxious and overwhelmed for about a week. She had some racing thoughts but also experienced low energy and was unable to take care of her family or participate in her regular activities. She has great difficulties describing her symptoms. She had two previous manic episodes that resolved with Lithium treatment. She contacted her  new primary doctor who prescribed Lithium 300 mg twice daily. Unfortunately, tha patient used Lithium in excess, possibly to induce sleep. She denies psychotic symptoms. She had more anxiety in the past week but no panic attacks, PTSD or OCD symptoms. She denies drug or alcohol use.  Past psychiatric history. She had two previous manic episodes 20 and 12 years ago. In 2006 she was hospitalized at [facility] with Dr. Owens Shark for postpartum psychosis with agitation that crashed into depression. Both times Lithium has been helpful and the  patient did not take any medications in between episodes. There were no suicide attempts.  Family psychiatric history. Yesterday she reported that her mother and grandmother had bipolar disorder. Today she corrects it stating that they have depression.   Social history. She is married and lives with her husband and two teenage children. She is angaged in Doctor, general practice activities that she now finds overwhelming.   Total Time spent with patient: 1 hour  Is the patient at risk to self? Yes.    Has the patient been a risk to self in the past 6 months? No.  Has the patient been a risk to self within the distant past? No.  Is the patient a risk to others? No.  Has the patient been a risk to others in the past 6 months? No.  Has the patient been a risk to others within the distant past? No.   Prior Inpatient Therapy:   Prior Outpatient Therapy:    Alcohol Screening: 1. How often do you have a drink containing alcohol?: Never 9. Have you or someone else been injured as a result of your drinking?: No 10. Has a relative or friend or a doctor or another health worker been concerned about your drinking or suggested you cut down?: No Alcohol Use Disorder Identification Test Final Score (AUDIT): 0 Substance Abuse History in the last 12 months:  No. Consequences of Substance Abuse: NA Previous Psychotropic Medications: Yes  Psychological Evaluations: No  Past Medical History:  Past Medical History:  Diagnosis Date  . Asthma    exercise induced    Past Surgical History:  Procedure Laterality Date  . CESAREAN SECTION     x  2   Family History: History reviewed. No pertinent family history.  Tobacco Screening: Have you used any form of tobacco in the last 30 days? (Cigarettes, Smokeless Tobacco, Cigars, and/or Pipes): No Social History:  History  Alcohol use Not on file     History  Drug Use No    Additional Social History:                           Allergies:    Allergies  Allergen Reactions  . Iodine Other (See Comments)    Childhood reaction- throat swelling and itching   Lab Results:  Results for orders placed or performed during the hospital encounter of 01/28/17 (from the past 48 hour(s))  Urinalysis, Complete w Microscopic     Status: Abnormal   Collection Time: 01/28/17  3:13 PM  Result Value Ref Range   Color, Urine AMBER (A) YELLOW    Comment: BIOCHEMICALS MAY BE AFFECTED BY COLOR   APPearance CLOUDY (A) CLEAR   Specific Gravity, Urine 1.025 1.005 - 1.030   pH 8.0 5.0 - 8.0   Glucose, UA NEGATIVE NEGATIVE mg/dL   Hgb urine dipstick NEGATIVE NEGATIVE   Bilirubin Urine NEGATIVE NEGATIVE   Ketones, ur 80 (A) NEGATIVE mg/dL   Protein, ur 100 (A) NEGATIVE mg/dL   Nitrite NEGATIVE NEGATIVE   Leukocytes, UA NEGATIVE NEGATIVE   RBC / HPF 0-5 0 - 5 RBC/hpf   WBC, UA 0-5 0 - 5 WBC/hpf   Bacteria, UA RARE (A) NONE SEEN   Squamous Epithelial / LPF 6-30 (A) NONE SEEN   Mucus PRESENT    Amorphous Crystal PRESENT    Triple Phosphate Crystal PRESENT    Uric Acid Crys, UA PRESENT    Crystals PRESENT (A) NEGATIVE  Ethanol     Status: None   Collection Time: 01/28/17  3:40 PM  Result Value Ref Range   Alcohol, Ethyl (B) <10 <10 mg/dL    Comment:        LOWEST DETECTABLE LIMIT FOR SERUM ALCOHOL IS 10 mg/dL FOR MEDICAL PURPOSES ONLY Please note change in reference range.   Lipase, blood     Status: None   Collection Time: 01/28/17  3:40 PM  Result Value Ref Range   Lipase 22 11 - 51 U/L  Acetaminophen level     Status: Abnormal   Collection Time: 01/28/17  3:40 PM  Result Value Ref Range   Acetaminophen (Tylenol), Serum <10 (L) 10 - 30 ug/mL    Comment:        THERAPEUTIC CONCENTRATIONS VARY SIGNIFICANTLY. A RANGE OF 10-30 ug/mL MAY BE AN EFFECTIVE CONCENTRATION FOR MANY PATIENTS. HOWEVER, SOME ARE BEST TREATED AT CONCENTRATIONS OUTSIDE THIS RANGE. ACETAMINOPHEN CONCENTRATIONS >150 ug/mL AT 4 HOURS AFTER INGESTION AND >50  ug/mL AT 12 HOURS AFTER INGESTION ARE OFTEN ASSOCIATED WITH TOXIC REACTIONS.   Salicylate level     Status: None   Collection Time: 01/28/17  3:40 PM  Result Value Ref Range   Salicylate Lvl <6.7 2.8 - 30.0 mg/dL  CBC with Differential     Status: Abnormal   Collection Time: 01/28/17  3:40 PM  Result Value Ref Range   WBC 13.6 (H) 3.6 - 11.0 K/uL   RBC 5.30 (H) 3.80 - 5.20 MIL/uL   Hemoglobin 16.1 (H) 12.0 - 16.0 g/dL   HCT 47.3 (H) 35.0 - 47.0 %   MCV 89.2 80.0 - 100.0 fL   MCH 30.4 26.0 - 34.0 pg  MCHC 34.0 32.0 - 36.0 g/dL   RDW 13.3 11.5 - 14.5 %   Platelets 284 150 - 440 K/uL   Neutrophils Relative % 87 %   Neutro Abs 11.8 (H) 1.4 - 6.5 K/uL   Lymphocytes Relative 8 %   Lymphs Abs 1.1 1.0 - 3.6 K/uL   Monocytes Relative 5 %   Monocytes Absolute 0.7 0.2 - 0.9 K/uL   Eosinophils Relative 0 %   Eosinophils Absolute 0.0 0 - 0.7 K/uL   Basophils Relative 0 %   Basophils Absolute 0.0 0 - 0.1 K/uL  Basic metabolic panel     Status: Abnormal   Collection Time: 01/28/17  3:40 PM  Result Value Ref Range   Sodium 137 135 - 145 mmol/L   Potassium 3.8 3.5 - 5.1 mmol/L   Chloride 102 101 - 111 mmol/L   CO2 26 22 - 32 mmol/L   Glucose, Bld 100 (H) 65 - 99 mg/dL   BUN 11 6 - 20 mg/dL   Creatinine, Ser 0.70 0.44 - 1.00 mg/dL   Calcium 9.7 8.9 - 10.3 mg/dL   GFR calc non Af Amer >60 >60 mL/min   GFR calc Af Amer >60 >60 mL/min    Comment: (NOTE) The eGFR has been calculated using the CKD EPI equation. This calculation has not been validated in all clinical situations. eGFR's persistently <60 mL/min signify possible Chronic Kidney Disease.    Anion gap 9 5 - 15  Hepatic function panel     Status: Abnormal   Collection Time: 01/28/17  3:40 PM  Result Value Ref Range   Total Protein 8.4 (H) 6.5 - 8.1 g/dL   Albumin 4.6 3.5 - 5.0 g/dL   AST 19 15 - 41 U/L   ALT 15 14 - 54 U/L   Alkaline Phosphatase 57 38 - 126 U/L   Total Bilirubin 1.0 0.3 - 1.2 mg/dL   Bilirubin, Direct  0.2 0.1 - 0.5 mg/dL   Indirect Bilirubin 0.8 0.3 - 0.9 mg/dL  TSH     Status: None   Collection Time: 01/28/17  3:40 PM  Result Value Ref Range   TSH 2.947 0.350 - 4.500 uIU/mL    Comment: Performed by a 3rd Generation assay with a functional sensitivity of <=0.01 uIU/mL.  Lactic acid, plasma     Status: None   Collection Time: 01/28/17  3:55 PM  Result Value Ref Range   Lactic Acid, Venous 0.9 0.5 - 1.9 mmol/L  Urine Drug Screen, Qualitative     Status: None   Collection Time: 01/28/17  3:55 PM  Result Value Ref Range   Tricyclic, Ur Screen NONE DETECTED NONE DETECTED   Amphetamines, Ur Screen NONE DETECTED NONE DETECTED   MDMA (Ecstasy)Ur Screen NONE DETECTED NONE DETECTED   Cocaine Metabolite,Ur [place] NONE DETECTED NONE DETECTED   Opiate, Ur Screen NONE DETECTED NONE DETECTED   Phencyclidine (PCP) Ur S NONE DETECTED NONE DETECTED   Cannabinoid 50 Ng, Ur [place] NONE DETECTED NONE DETECTED   Barbiturates, Ur Screen NONE DETECTED NONE DETECTED   Benzodiazepine, Ur Scrn NONE DETECTED NONE DETECTED   Methadone Scn, Ur NONE DETECTED NONE DETECTED    Comment: (NOTE) 283  Tricyclics, urine               Cutoff 1000 ng/mL 200  Amphetamines, urine             Cutoff 1000 ng/mL 300  MDMA (Ecstasy), urine  Cutoff 500 ng/mL 400  Cocaine Metabolite, urine       Cutoff 300 ng/mL 500  Opiate, urine                   Cutoff 300 ng/mL 600  Phencyclidine (PCP), urine      Cutoff 25 ng/mL 700  Cannabinoid, urine              Cutoff 50 ng/mL 800  Barbiturates, urine             Cutoff 200 ng/mL 900  Benzodiazepine, urine           Cutoff 200 ng/mL 1000 Methadone, urine                Cutoff 300 ng/mL 1100 1200 The urine drug screen provides only a preliminary, unconfirmed 1300 analytical test result and should not be used for non-medical 1400 purposes. Clinical consideration and professional judgment should 1500 be applied to any positive drug screen result due to possible 1600 interfering  substances. A more specific alternate chemical method 1700 must be used in order to obtain a confirmed analytical result.  1800 Gas chromato graphy / mass spectrometry (GC/MS) is the preferred 1900 confirmatory method.   Lithium level     Status: Abnormal   Collection Time: 01/28/17  5:44 PM  Result Value Ref Range   Lithium Lvl 2.70 (HH) 0.60 - 1.20 mmol/L    Comment: CRITICAL RESULT CALLED TO, READ BACK BY AND VERIFIED WITH REBECCA UHORCHUK 01/28/17 @ 2047  [place]   Pregnancy, urine POC     Status: None   Collection Time: 01/28/17  7:08 PM  Result Value Ref Range   Preg Test, Ur NEGATIVE NEGATIVE    Comment:        THE SENSITIVITY OF THIS METHODOLOGY IS >24 mIU/mL   Lithium level     Status: Abnormal   Collection Time: 01/28/17  9:42 PM  Result Value Ref Range   Lithium Lvl 2.10 (HH) 0.60 - 1.20 mmol/L    Comment: CRITICAL RESULT CALLED TO, READ BACK BY AND VERIFIED WITH REBECCA UHORCHUK 01/28/17 @ 2219  MLK   Lithium level     Status: Abnormal   Collection Time: 01/29/17  1:25 PM  Result Value Ref Range   Lithium Lvl 1.21 (H) 0.60 - 1.20 mmol/L    Blood Alcohol level:  Lab Results  Component Value Date   ETH <10 28/00/3491    Metabolic Disorder Labs:  No results found for: HGBA1C, MPG No results found for: PROLACTIN No results found for: CHOL, TRIG, HDL, CHOLHDL, VLDL, LDLCALC  Current Medications: Current Facility-Administered Medications  Medication Dose Route Frequency Provider Last Rate Last Dose  . acetaminophen (TYLENOL) tablet 650 mg  650 mg Oral Q6H PRN [name], [name], MD      . alum & mag hydroxide-simeth (MAALOX/MYLANTA) 200-200-20 MG/5ML suspension 30 mL  30 mL Oral Q4H PRN [name], [name], MD      . magnesium hydroxide (MILK OF MAGNESIA) suspension 30 mL  30 mL Oral Daily PRN [name], [name], MD      . QUEtiapine (SEROQUEL) tablet 100 mg  100 mg Oral QHS Kealii Thueson B, MD      . temazepam (RESTORIL) capsule 15 mg  15 mg Oral QHS PRN [name], Madie Reno, MD   15 mg at 01/29/17 2335   PTA Medications: Prescriptions Prior to Admission  Medication Sig Dispense Refill Last Dose  . aspirin-acetaminophen-caffeine ([place])  250-250-65 MG per tablet Take 2 tablets by mouth as needed. For headache pain    prn at prn  . ibuprofen (ADVIL,MOTRIN) 200 MG tablet Take 600 mg by mouth as needed. For pain    prn at prn  . lithium 300 MG tablet Take 300 mg by mouth 2 (two) times daily.  3 Unknown at Unknown    Musculoskeletal: Strength & Muscle Tone: within normal limits Gait & Station: normal Patient leans: N/A  Psychiatric Specialty Exam: Physical Exam  Nursing note and vitals reviewed. Constitutional: She is oriented to person, place, and time. She appears well-developed and well-nourished.  HENT:  Head: Normocephalic and atraumatic.  Eyes: Pupils are equal, round, and reactive to light. Conjunctivae and EOM are normal.  Neck: Normal range of motion. Neck supple.  Cardiovascular: Normal rate, regular rhythm and normal heart sounds.   Respiratory: Effort normal and breath sounds normal.  GI: Soft. Bowel sounds are normal.  Musculoskeletal: Normal range of motion.  Neurological: She is alert and oriented to person, place, and time.  Skin: Skin is warm and dry.  Psychiatric: She has a normal mood and affect. Her speech is normal and behavior is normal. Thought content normal. Cognition and memory are normal. She expresses impulsivity.    Review of Systems  Constitutional: Negative.   HENT: Negative.   Eyes: Negative.   Respiratory: Negative.   Cardiovascular: Negative.   Gastrointestinal: Negative.   Genitourinary: Negative.   Musculoskeletal: Negative.   Skin: Negative.   Neurological: Negative.   Endo/Heme/Allergies: Negative.   Psychiatric/Behavioral: The patient is nervous/anxious and has insomnia.     Blood pressure 115/78, pulse 87, temperature 98.7 F (37.1 C), temperature source Oral, resp. rate 18, height 5' 6"   (1.676 m), weight 84.4 kg (186 lb), SpO2 98 %.Body mass index is 30.02 kg/m.  See SRA.                                                  Sleep:  Number of Hours: 5.45    Treatment Plan Summary: Daily contact with patient to assess and evaluate symptoms and progress in treatment and Medication management   Ms. Rudden is a 46 year old female with a istory of bipolar illness admitted for Lithium overdose while manic.  1. Suicidal ideation. The patient adamantly denies any thoughts, intention or plans to hurt herself or others. She is able to contract for safety in the hospital.  2. Mood and psychosis.She wishes to restart the Lithium. I am waiting for today's Li level. We will consider Seroquel as a mood stabilizer/sleeping aid.   3. Insomnia. She took Restoril last night but her sleep was interrupted.   4. Lithium overdose. Lithium level today.  5. Metabolic syndrome monitoring. Lipid panel, TSH and HgbA1C are pending.  6. EKG. Pending.  7. Pregnancy test is negative.  8. Disposition. She will be discharged to home with family. She will follow up with mental health professional.   Observation Level/Precautions:  15 minute checks  Laboratory:  CBC Chemistry Profile UDS UA  Psychotherapy:    Medications:    Consultations:    Discharge Concerns:    Estimated LOS:  Other:     Physician Treatment Plan for Primary Diagnosis: Bipolar affective disorder, current episode manic ([place]) Long Term Goal(s): Improvement in symptoms so as ready for discharge  Short Term Goals:  Ability to identify changes in lifestyle to reduce recurrence of condition will improve, Ability to verbalize feelings will improve, Ability to disclose and discuss suicidal ideas, Ability to demonstrate self-control will improve, Ability to identify and develop effective coping behaviors will improve, Ability to maintain clinical measurements within normal limits will improve, Compliance with  prescribed medications will improve and Ability to identify triggers associated with substance abuse/mental health issues will improve  Physician Treatment Plan for Secondary Diagnosis: Principal Problem:   Bipolar affective disorder, current episode manic ([place]) Active Problems:   Lithium overdose  Long Term Goal(s): NA  Short Term Goals: Ability to identify changes in lifestyle to reduce recurrence of condition will improve, Ability to demonstrate self-control will improve and Ability to identify triggers associated with substance abuse/mental health issues will improve  I certify that inpatient services furnished can reasonably be expected to improve the patient's condition.    Orson Slick, MD 10/5/20187:54 AM

## 2017-01-30 NOTE — [facility] Suicide Risk Assessment (Signed)
[facility] Admission Suicide Risk Assessment   Nursing information obtained from:    Demographic factors:    Current Mental Status:    Loss Factors:    Historical Factors:    Risk Reduction Factors:     Total Time spent with patient: 1 hour Principal Problem: Bipolar affective disorder, current episode manic (HCC) Diagnosis:   Patient Active Problem List   Diagnosis Date Noted  . Lithium overdose [T56.891A] 01/29/2017  . Bipolar affective disorder, current episode manic (HCC) [F31.9] 01/29/2017  . Abnormal vaginal bleeding [N93.9] 12/13/2010   Subjective Data: overdose.  Continued Clinical Symptoms:  Alcohol Use Disorder Identification Test Final Score (AUDIT): 0 The "Alcohol Use Disorders Identification Test", Guidelines for Use in Primary Care, Second Edition.  World Science writer [facility]). Score between 0-7:  no or low risk or alcohol related problems. Score between 8-15:  moderate risk of alcohol related problems. Score between 16-19:  high risk of alcohol related problems. Score 20 or above:  warrants further diagnostic evaluation for alcohol dependence and treatment.   CLINICAL FACTORS:   Bipolar Disorder:   Mixed State Currently Psychotic   Musculoskeletal: Strength & Muscle Tone: within normal limits Gait & Station: normal Patient leans: N/A  Psychiatric Specialty Exam: Physical Exam  Nursing note and vitals reviewed. Psychiatric: She has a normal mood and affect. Her speech is normal and behavior is normal. Thought content normal. Cognition and memory are normal. She expresses impulsivity.    Review of Systems  Constitutional: Negative.   HENT: Negative.   Eyes: Negative.   Respiratory: Negative.   Cardiovascular: Negative.   Gastrointestinal: Negative.   Genitourinary: Negative.   Musculoskeletal: Negative.   Skin: Negative.   Neurological: Negative.   Endo/Heme/Allergies: Negative.   Psychiatric/Behavioral: The patient is nervous/anxious and has  insomnia.     Blood pressure 115/78, pulse 87, temperature 98.7 F (37.1 C), temperature source Oral, resp. rate 18, height  (1.676 m), weight 84.4 kg (186 lb), SpO2 98 %.Body mass index is 30.02 kg/m.  General Appearance: Casual  Eye Contact:  Good  Speech:  Clear and Coherent  Volume:  Normal  Mood:  Euthymic  Affect:  Appropriate  Thought Process:  Goal Directed and Descriptions of Associations: Intact  Orientation:  Full (Time, Place, and Person)  Thought Content:  WDL  Suicidal Thoughts:  No  Homicidal Thoughts:  No  Memory:  Immediate;   Fair Recent;   Fair Remote;   Fair  Judgement:  Poor  Insight:  Lacking  Psychomotor Activity:  Normal  Concentration:  Concentration: Fair and Attention Span: Fair  Recall:  [employer] of Knowledge:  Fair  Language:  Fair  Akathisia:  No  Handed:  Right  AIMS (if indicated):     Assets:  Communication Skills Desire for Improvement Financial Resources/Insurance Housing Physical Health Resilience Social Support  ADL's:  Intact  Cognition:  WNL  Sleep:  Number of Hours: 5.45      COGNITIVE FEATURES THAT CONTRIBUTE TO RISK:  None    SUICIDE RISK:   Moderate:  Frequent suicidal ideation with limited intensity, and duration, some specificity in terms of plans, no associated intent, good self-control, limited dysphoria/symptomatology, some risk factors present, and identifiable protective factors, including available and accessible social support.  PLAN OF CARE: hospital admission, medication management, discharge planning.  Ms. Poland is a 46 year old female with a istory of bipolar illness admitted for Lithium overdose while manic.  1. Suicidal ideation. The patient adamantly denies any thoughts,  intention or plans to hurt herself or others. She is able to contract for safety in the hospital.  2. Mood and psychosis.She wishes to restart the Lithium. I am waiting for today's Li level. We will consider Seroquel as a mood  stabilizer/sleeping aid.   3. Insomnia. She took Restoril last night but her sleep was interrupted.   4. Lithium overdose. Lithium level today.  5. Metabolic syndrome monitoring. Lipid panel, TSH and HgbA1C are pending.  6. EKG. Pending.  7. Pregnancy test is negative.  8. Disposition. She will be discharged to home with family. She will follow up with mental health professional.    I certify that inpatient services furnished can reasonably be expected to improve the patient's condition.   Kristine Linea, MD 01/30/2017, 7:47 AM

## 2017-01-30 NOTE — Progress Notes (Signed)
Patient ID: Sarah Dixon, female   DOB: Jun 06, 1970, 46 y.o.   MRN: [id number] Patient presents involuntarily secondary to increased depression. Admitted after overdosing on Lithium "I was trying to go to sleep". Patient reports that she has been experiencing difficulty falling asleep. Reports that she had been busy working oh her new home and volunteering and became overwhelmed. Reports poor appetite  And low self esteem. Patient is sad and depressed. Admits that she has thought about harming herself but currently denying. Skin assessment performed by this Clinical research associate, assisted by Guido Sander, RN. No contraband found. Patient was oriented to the unit and safety precautions initiated. Will be evaluated by MD in AM.

## 2017-01-30 NOTE — Plan of Care (Signed)
Problem: Activity: Goal: Interest or engagement in leisure activities will improve Patient will be able to attend groups and participate by day 2 of admission Outcome: Not Progressing Newly admitted

## 2017-01-30 NOTE — Plan of Care (Signed)
Problem: Activity: Goal: Sleeping patterns will improve Outcome: Progressing Patient slept for Estimated Hours of 5.45; Precautionary checks every 15 minutes for safety maintained, room free of safety hazards, patient sustains no injury or falls during this shift.    

## 2017-01-30 NOTE — [facility] Suicide Risk Assessment (Signed)
[facility] INPATIENT:  Family/Significant Other Suicide Prevention Education  Suicide Prevention Education:  Contact Attempts: Ward Caspers, husband, [phone] has been identified by the patient as the family member/significant other with whom the patient will be residing, and identified as the person(s) who will aid the patient in the event of a mental health crisis.  With written consent from the patient, two attempts were made to provide suicide prevention education, prior to and/or following the patient's discharge.  We were unsuccessful in providing suicide prevention education.  A suicide education pamphlet was given to the patient to share with family/significant other.  Date and time of first attempt:01/30/17, 1454 Date and time of second attempt:  [name], LCSW 01/30/2017, 2:54 PM

## 2017-01-30 NOTE — Plan of Care (Signed)
Problem: Coping: Goal: Ability to verbalize feelings will improve Outcome: Progressing Verbalizes feelings without difficulty.     

## 2017-01-30 NOTE — Tx Team (Signed)
Initial Treatment Plan 01/30/2017 12:23 AM Sarah Dixon ZOX:[id number]    PATIENT STRESSORS: Occupational concerns Other: lack of sleep   PATIENT STRENGTHS: Ability for insight Average or above average intelligence Motivation for treatment/growth Supportive family/friends   PATIENT IDENTIFIED PROBLEMS: Depression  Insomnia  Hopelessness                 DISCHARGE CRITERIA:  Ability to meet basic life and health needs Adequate post-discharge living arrangements Motivation to continue treatment in a less acute level of care  PRELIMINARY DISCHARGE PLAN: Outpatient therapy Return to previous living arrangement  PATIENT/FAMILY INVOLVEMENT: This treatment plan has been presented to and reviewed with the patient, [name].  The patient has been given the opportunity to ask questions and make suggestions.  Olin Pia, RN 01/30/2017, 12:23 AM

## 2017-01-30 NOTE — [facility] Suicide Risk Assessment (Signed)
[facility] Admission Suicide Risk Assessment   Nursing information obtained from:    Demographic factors:    Current Mental Status:    Loss Factors:    Historical Factors:    Risk Reduction Factors:     Total Time spent with patient: 1 hour Principal Problem: Bipolar affective disorder, current episode manic (HCC) Diagnosis:   Patient Active Problem List   Diagnosis Date Noted  . Lithium overdose [T56.891A] 01/29/2017  . Bipolar affective disorder, current episode manic (HCC) [F31.9] 01/29/2017  . Abnormal vaginal bleeding [N93.9] 12/13/2010   Subjective Data: overdose.  Continued Clinical Symptoms:  Alcohol Use Disorder Identification Test Final Score (AUDIT): 0 The "Alcohol Use Disorders Identification Test", Guidelines for Use in Primary Care, Second Edition.  World Science writer [facility]). Score between 0-7:  no or low risk or alcohol related problems. Score between 8-15:  moderate risk of alcohol related problems. Score between 16-19:  high risk of alcohol related problems. Score 20 or above:  warrants further diagnostic evaluation for alcohol dependence and treatment.   CLINICAL FACTORS:   Bipolar Disorder:   Mixed State Currently Psychotic   Musculoskeletal: Strength & Muscle Tone: within normal limits Gait & Station: normal Patient leans: N/A  Psychiatric Specialty Exam: Physical Exam  Nursing note and vitals reviewed. Respiratory: Breath sounds normal.  GI: Bowel sounds are normal.  Psychiatric: She has a normal mood and affect. Her speech is normal and behavior is normal. Thought content normal. Cognition and memory are normal. She expresses impulsivity.    Review of Systems  Constitutional: Negative.   HENT: Negative.   Eyes: Negative.   Respiratory: Negative.   Cardiovascular: Negative.   Gastrointestinal: Negative.   Genitourinary: Negative.   Musculoskeletal: Negative.   Skin: Negative.   Neurological: Negative.   Endo/Heme/Allergies: Negative.    Psychiatric/Behavioral: The patient is nervous/anxious and has insomnia.     Blood pressure 115/78, pulse 87, temperature 98.7 F (37.1 C), temperature source Oral, resp. rate 18, height  (1.676 m), weight 84.4 kg (186 lb), SpO2 98 %.Body mass index is 30.02 kg/m.  General Appearance: Casual  Eye Contact:  Good  Speech:  Clear and Coherent  Volume:  Normal  Mood:  Euthymic  Affect:  Appropriate  Thought Process:  Goal Directed and Descriptions of Associations: Intact  Orientation:  Full (Time, Place, and Person)  Thought Content:  WDL  Suicidal Thoughts:  No  Homicidal Thoughts:  No  Memory:  Immediate;   Fair Recent;   Fair Remote;   Fair  Judgement:  Poor  Insight:  Lacking  Psychomotor Activity:  Normal  Concentration:  Concentration: Fair and Attention Span: Fair  Recall:  [employer] of Knowledge:  Fair  Language:  Fair  Akathisia:  No  Handed:  Right  AIMS (if indicated):     Assets:  Communication Skills Desire for Improvement Financial Resources/Insurance Housing Physical Health Resilience Social Support  ADL's:  Intact  Cognition:  WNL  Sleep:  Number of Hours: 5.45      COGNITIVE FEATURES THAT CONTRIBUTE TO RISK:  None    SUICIDE RISK:   Moderate:  Frequent suicidal ideation with limited intensity, and duration, some specificity in terms of plans, no associated intent, good self-control, limited dysphoria/symptomatology, some risk factors present, and identifiable protective factors, including available and accessible social support.  PLAN OF CARE: hospital admission, medication management, discharge planning.  Sarah Dixon is a 46 year old female with a istory of bipolar illness admitted for Lithium overdose while  manic.  1. Suicidal ideation. The patient adamantly denies any thoughts, intention or plans to hurt herself or others. She is able to contract for safety in the hospital.  2. Mood and psychosis.She wishes to restart the Lithium. I am  waiting for today's Li level. We will consider Seroquel as a mood stabilizer/sleeping aid.   3. Insomnia. She took Restoril last night but her sleep was interrupted.   4. Lithium overdose. Lithium level today.  5. Metabolic syndrome monitoring. Lipid panel, TSH and HgbA1C are pending.  6. EKG. Pending.  7. Pregnancy test is negative.  8. Disposition. She will be discharged to home with family. She will follow up with mental health professional.    I certify that inpatient services furnished can reasonably be expected to improve the patient's condition.   Kristine Linea, MD 01/30/2017, 7:52 AM

## 2017-01-30 NOTE — [facility] Group Notes (Signed)
[facility] Group Notes:  (Nursing/MHT/Case Management/Adjunct)  Date:  01/30/2017  Time:  5:10 PM  Type of Therapy:  Psychoeducational Skills  Participation Level:  Active  Participation Quality:  Appropriate and Attentive  Affect:  Appropriate and Excited  Cognitive:  Alert and Appropriate  Insight:  Appropriate and Good  Engagement in Group:  Developing/Improving, Engaged and Supportive  Modes of Intervention:  Discussion, Education and Socialization  Summary of Progress/Problems:  Sarah Dixon 01/30/2017, 5:10 PM

## 2017-01-31 LAB — LITHIUM LEVEL: Lithium Lvl: 0.61 mmol/L (ref 0.60–1.20)

## 2017-01-31 MED ORDER — LITHIUM CARBONATE ER 300 MG PO TBCR
300.0000 mg | EXTENDED_RELEASE_TABLET | Freq: Every day | ORAL | Status: DC
  Administered 2017-02-01 – 2017-02-04 (×4): 300 mg via ORAL
  Filled 2017-01-31 (×4): qty 1

## 2017-01-31 MED ORDER — LITHIUM CARBONATE ER 300 MG PO TBCR
600.0000 mg | EXTENDED_RELEASE_TABLET | Freq: Every day | ORAL | Status: DC
  Administered 2017-01-31 – 2017-02-03 (×4): 600 mg via ORAL
  Filled 2017-01-31 (×5): qty 2

## 2017-01-31 NOTE — Plan of Care (Signed)
Problem: Safety: Goal: Ability to disclose and discuss suicidal ideas will improve Outcome: Progressing Patient denies SI/HI/AVH and contracts for safety.  Problem: Education: Goal: Mental status will improve Outcome: Progressing Patient was approached at change of shift and was observed visiting with husband.  She asked appropriate questions about clothing she could keep or needed to send home.  She was accepting of information and sent home one pair of leggings with strings and one pair of shorts with strings. She made several references to "having to get up so early"  and engaged with Clinical research associate about how tired she is today.  She was given information about how medications are ordered for sleep and she acknowledged understanding.

## 2017-01-31 NOTE — Plan of Care (Signed)
Problem: Safety: Goal: Ability to disclose and discuss suicidal ideas will improve Outcome: Progressing Denies SI or any thoughts of harming self in any way.

## 2017-01-31 NOTE — [facility] Group Notes (Signed)
LCSW Group Therapy Note  01/31/2017 1:00pm  Type of Therapy and Topic:  Group Therapy:  Cognitive Distortions  Participation Level:  Active   Description of Group:    Patients in this group will be introduced to the topic of cognitive distortions.  Patients will identify and describe cognitive distortions, describe the feelings these distortions create for them.  Patients will identify one or more situations in their personal life where they have cognitively distorted thinking and will verbalize challenging this cognitive distortion through positive thinking skills.  Patients will practice the skill of using positive affirmations to challenge cognitive distortions using affirmation cards.    Therapeutic Goals:  1. Patient will identify two or more cognitive distortions they have used 2. Patient will identify one or more emotions that stem from use of a cognitive distortion 3. Patient will demonstrate use of a positive affirmation to counter a cognitive distortion through discussion and/or role play. 4. Patient will describe one way cognitive distortions can be detrimental to wellness   Summary of Patient Progress:  Pt participated appropriately in group.  Seemed to have some insight into cognitive distortions.  She was able to give examples of affirmations she will use.  She utilizes scripture to reinforce what she believes God thinks of her and focuses on this rather than her own distorted thoughts or unhelpful thoughts.   Therapeutic Modalities:   Cognitive Behavioral Therapy Motivational Interviewing   Glennon Mac, LCSW 01/31/2017 3:31 PM

## 2017-01-31 NOTE — Progress Notes (Signed)
[facility] MD Progress Note  01/31/2017 11:08 AM SHARIE AMORIN  MRN:  [id number]   Subjective:    History of present illness. [name] is a 46 year old patient female with a prior diagnosis of bipolar disorder who presents to the emergency room after overdosing on lithium after feeling overwhelmed and depressed. The patient came to the emergency room after overdose on lithium.   01/31/17: The patient reports that she is feeling "okay" today but affect is quite anxious. She slept 7 hours last night but says her sleep is interrupted as nursing staff kept coming into the room. She denies any current active or passive suicidal thoughts and says she does not want to kill herself but she was feeling very overwhelmed with a lot of responsibilities after purchasing a beach house. The patient denies any current psychosis including auditory or visual hallucinations. No paranoid thoughts or delusions. She denies any active or passive suicidal thoughts or homicidal thoughts. She denies any somatic complaints. Lithium level is down to 0.61. Vital signs have been stable. She tolerated the Seroquel last night fairly well and denies any physical adverse side effects associated with the medication.  Supportive psychotherapy provided and Times and discussing mindfulness skills to help with relaxation and decrease feelings of being overwhelmed.   Past psychiatric history. She had two previous manic episodes 20 and 12 years ago. In 2006 she was hospitalized at [facility] with Dr. 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 Both times Lithium has been helpful and the patient did not take any medications in between episodes. There were no suicide attempts.  Family psychiatric history. Yesterday she reported that her mother and grandmother had bipolar disorder. Today she corrects it stating that they have depression.   Social history. She is married and lives with her husband and two  teenage children. 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    Principal Problem: Bipolar affective disorder, current episode manic (HCC) Diagnosis:   Patient Active Problem List   Diagnosis Date Noted  . Lithium overdose [T56.891A] 01/29/2017  . Bipolar affective disorder, current episode manic (HCC) [F31.9] 01/29/2017  . Abnormal vaginal bleeding [N93.9] 12/13/2010   Total Time spent with patient: 30 minutes   Past Medical History:  Past Medical History:  Diagnosis Date  . Asthma    exercise induced    Past Surgical History:  Procedure Laterality Date  . CESAREAN SECTION     x 2   Social History:  History  Alcohol use Not on file     History  Drug Use No    Social History   Social History  . Marital status: Married    Spouse name: N/A  . Number of children: N/A  . Years of education: N/A   Social History Main Topics  . Smoking status: Never Smoker  . Smokeless tobacco: Never Used  . Alcohol use None  . Drug use: No  . Sexual activity: Yes    Birth control/ protection: Surgical   Other Topics Concern  . None   Social History Narrative  . None   Sleep: Good  Appetite:  Good  Current Medications: Current Facility-Administered Medications  Medication Dose Route Frequency Provider Last Rate Last Dose  . acetaminophen (TYLENOL) tablet 650 mg  650 mg Oral Q6H PRN [name], [name], MD      . alum & mag hydroxide-simeth (MAALOX/MYLANTA) 200-200-20 MG/5ML suspension 30 mL  30 mL Oral Q4H PRN [name], [name],  MD      . Melene Muller ON 02/01/2017] lithium carbonate (LITHOBID) CR tablet 300 mg  300 mg Oral QPC breakfast [name], MD      . lithium carbonate (LITHOBID) CR tablet 600 mg  600 mg Oral QHS [name], MD      . magnesium hydroxide (MILK OF MAGNESIA) suspension 30 mL  30 mL Oral Daily PRN [name], [name], MD      . QUEtiapine (SEROQUEL) tablet 100 mg  100 mg Oral QHS Pucilowska, Jolanta B, MD   100 mg at 01/30/17  2122  . temazepam (RESTORIL) capsule 15 mg  15 mg Oral QHS PRN [name], [name], MD   15 mg at 01/30/17 2123    Lab Results:  Results for orders placed or performed during the hospital encounter of 01/29/17 (from the past 48 hour(s))  Hemoglobin A1c     Status: None   Collection Time: 01/30/17  7:05 AM  Result Value Ref Range   Hgb A1c MFr Bld 5.3 4.8 - 5.6 %    Comment: (NOTE) Pre diabetes:          5.7%-6.4% Diabetes:              >6.4% Glycemic control for   <7.0% adults with diabetes    Mean Plasma Glucose 105.41 mg/dL    Comment: Performed at [facility], [address] [place], [place] [address]  Lipid panel     Status: Abnormal   Collection Time: 01/30/17  7:05 AM  Result Value Ref Range   Cholesterol 169 0 - 200 mg/dL   Triglycerides 73 <604 mg/dL   HDL 50 >54 mg/dL   Total CHOL/HDL Ratio 3.4 RATIO   VLDL 15 0 - 40 mg/dL   LDL Cholesterol 098 (H) 0 - 99 mg/dL    Comment:        Total Cholesterol/HDL:CHD Risk Coronary Heart Disease Risk Table                     Men   Women  1/2 Average Risk   3.4   3.3  Average Risk       5.0   4.4  2 X Average Risk   9.6   7.1  3 X Average Risk  23.4   11.0        Use the calculated Patient Ratio above and the CHD Risk Table to determine the patient's CHD Risk.        ATP III CLASSIFICATION (LDL):  <100     mg/dL   Optimal  119-147  mg/dL   Near or Above                    Optimal  130-159  mg/dL   Borderline  829-562  mg/dL   High  >130     mg/dL   Very High   TSH     Status: None   Collection Time: 01/30/17  7:05 AM  Result Value Ref Range   TSH 4.483 0.350 - 4.500 uIU/mL    Comment: Performed by a 3rd Generation assay with a functional sensitivity of <=0.01 uIU/mL.  Lithium level     Status: None   Collection Time: 01/30/17  7:05 AM  Result Value Ref Range   Lithium Lvl 0.70 0.60 - 1.20 mmol/L  Lithium level     Status: None   Collection Time: 01/31/17  7:20 AM  Result Value Ref Range  Lithium Lvl 0.61  0.60 - 1.20 mmol/L    Blood Alcohol level:  Lab Results  Component Value Date   ETH <10 01/28/2017    Metabolic Disorder Labs: Lab Results  Component Value Date   HGBA1C 5.3 01/30/2017   MPG 105.41 01/30/2017   No results found for: PROLACTIN Lab Results  Component Value Date   CHOL 169 01/30/2017   TRIG 73 01/30/2017   HDL 50 01/30/2017   CHOLHDL 3.4 01/30/2017   VLDL 15 01/30/2017   LDLCALC 104 (H) 01/30/2017    Physical Findings: AIMS:  , ,  ,  ,    CIWA:    COWS:     Musculoskeletal: Strength & Muscle Tone: within normal limits Gait & Station: normal Patient leans: N/A  Psychiatric Specialty Exam: Physical Exam  Review of Systems  Constitutional: Negative.   HENT: Negative.   Eyes: Negative.   Respiratory: Negative.   Cardiovascular: Negative.   Gastrointestinal: Negative.   Genitourinary: Negative.   Musculoskeletal: Negative.   Skin: Negative.   Neurological: Negative.   Endo/Heme/Allergies: Negative.     Blood pressure 123/76, pulse 90, temperature 98.5 F (36.9 C), temperature source Oral, resp. rate 18, height  (1.676 m), weight 84.4 kg (186 lb), SpO2 98 %.Body mass index is 30.02 kg/m.  General Appearance: Casual  Eye Contact:  Good  Speech:  Clear and Coherent and Normal Rate  Volume:  Normal  Mood:  Depressed  Affect:  Anxious  Thought Process:  Goal Directed and Linear  Orientation:  Full (Time, Place, and Person)  Thought Content:  Logical  Suicidal Thoughts:  No  Homicidal Thoughts:  No  Memory:  Immediate;   Good Recent;   Good Remote;   Good  Judgement:  Other:  Good by testing, recently not good with overdose  Insight:  Good  Psychomotor Activity:  Normal  Concentration:  Concentration: Fair and Attention Span: Fair  Recall:  Good  Fund of Knowledge:  Good  Language:  Good  Akathisia:  No  Handed:  Right  AIMS (if indicated):     Assets:  Architect Housing Physical  Health Social Support Talents/Skills Transportation Vocational/Educational  ADL's:  Intact  Cognition:  WNL  Sleep:  Number of Hours: 7     Treatment Plan Summary:  Bipolar Disorder,MRE Depressed  Ms. Bruni is a 46 year old female with a istory of bipolar illness admitted for Lithium overdose while manic.  1. Suicidal ideation. The patient adamantly denies any thoughts, intention or plans to hurt herself or others. She is able to contract for safety in the hospital.  2. Mood and psychosis. Lithium level was 0.60. Lithium was restarted at 300 mg by mouth twice a day we'll plan to increase lithium to a total of 300 mg by mouth daily in the morning and 600 mg at bedtime. Patient was also started on Seroquel 100 mg by mouth nightly for mood stabilization. Hemoglobin A1c was 5.3 total cholesterol is 169. EKG showed a QTC of 435.  3. Insomnia. She took Restoril last night but her sleep was interrupted.   5. Metabolic syndrome monitoring. Lipid panel, TSH and HgbA1C were within normal limits  6. EKG. shows EKG showed a QTc interval 435.  7. Pregnancy test is negative.  8. Disposition. She will be discharged to home with family. She will need to follow up for psychotropic medication management after discharge.   Daily contact with patient to assess and evaluate symptoms and progress in treatment  and Medication management  Levora Angel, MD 01/31/2017, 11:08 AM

## 2017-02-01 MED ORDER — QUETIAPINE FUMARATE 25 MG PO TABS
150.0000 mg | ORAL_TABLET | Freq: Every day | ORAL | Status: DC
  Administered 2017-02-01: 150 mg via ORAL
  Filled 2017-02-01: qty 1

## 2017-02-01 NOTE — Progress Notes (Signed)
Denies SI/HI/AVH.  Denies any depression. Affect bright.  Medication and group compliant.  Support and encouragement offered.  Safety rounds maintained.

## 2017-02-01 NOTE — Plan of Care (Signed)
Problem: Coping: Goal: Ability to cope will improve Outcome: Progressing Patient was observed interacting with peers and playing a game.  She was calm and pleasant in interactions.  She continues to self disclose minimally and is guarded. Goal: Ability to verbalize feelings will improve Outcome: Progressing Patient denies SI/HI/AV/H and contracts for safety.

## 2017-02-01 NOTE — [facility] Group Notes (Signed)
[facility] Group Notes:  (Nursing/MHT/Case Management/Adjunct)  Date:  02/01/2017  Time:  9:36 PM  Type of Therapy:  Group Therapy  Participation Level:  Active  Participation Quality:  Appropriate  Affect:  Appropriate  Cognitive:  Appropriate  Insight:  Good  Engagement in Group:  Engaged  Modes of Intervention:  Education and Support  Summary of Progress/Problems:  Corneluis Allston A Seriah Brotzman 02/01/2017, 9:36 PM

## 2017-02-01 NOTE — Plan of Care (Signed)
Problem: Safety: Goal: Ability to disclose and discuss suicidal ideas will improve Outcome: Progressing Denies SI  Problem: Education: Goal: Mental status will improve Outcome: Progressing Engages more with staff and peers.   Verbalizes that depression is decreasing.  Rates depression as 3/10.  Denies any thoughts of harming self or any thoughts of SI

## 2017-02-01 NOTE — [facility] Group Notes (Signed)
[facility] LCSW Group Therapy 02/01/2017 1:15pm  Type of Therapy: Group Therapy- Feelings Around Discharge & Establishing a Supportive Framework  Participation Level:  Minimal  Description of Group:   What is a supportive framework? What does it look like feel like and how do I discern it from and unhealthy non-supportive network? Learn how to cope when supports are not helpful and don't support you. Discuss what to do when your family/friends are not supportive.  Summary of Patient Progress Pt participated minimally but was able to identify characteristics of a positive support such as being honest and reliable.   Therapeutic Modalities:   Cognitive Behavioral Therapy Person-Centered Therapy Motivational Interviewing   Verdene Lennert, LCSW 02/01/2017 12:59 PM

## 2017-02-01 NOTE — Progress Notes (Signed)
[facility] MD Progress Note  02/01/2017 4:43 PM Valla Pacey  MRN:  [id number]   Subjective:    History of present illness. [name] is a 46 year old patient female with a prior diagnosis of bipolar disorder who presents to the emergency room after overdosing on lithium after feeling overwhelmed and depressed. The patient came to the emergency room after overdose on lithium.   01/31/17: The patient reports that she is feeling "okay" today but affect is quite anxious. She slept 7 hours last night but says her sleep is interrupted as nursing staff kept coming into the room. She denies any current active or passive suicidal thoughts and says she does not want to kill herself but she was feeling very overwhelmed with a lot of responsibilities after purchasing a beach house. The patient denies any current psychosis including auditory or visual hallucinations. No paranoid thoughts or delusions. She denies any active or passive suicidal thoughts or homicidal thoughts. She denies any somatic complaints. Lithium level is down to 0.61. Vital signs have been stable. She tolerated the Seroquel last night fairly well and denies any physical adverse side effects associated with the medication.  Supportive psychotherapy provided and Times and discussing mindfulness skills to help with relaxation and decrease feelings of being overwhelmed.   02/01/17  The patient is having a difficult time verbalizing her feelings. She says she thought were his symptoms started prior to her. She does appear to be very anxious and fearful but cannot identify specific triggers for anxiety. She does admit that the purchase of the beach home caused a lot of stress for her. She says even going to get the children ready for school was overwhelming. She cannot identify specific triggers for the overdose. She denies any current active or passive suicidal thoughts or psychotic symptoms. She has had some ongoing insomnia and finds it  difficult to sleep at night. Nursing reported 6 hours of sleep last night. He denies any somatic complaints and has been tolerating psychotropic medications fairly well other than dry mouth. She denies any problems with her appetite. She has been attending groups and interacts well with staff and peers. Vital signs are stable.   Past psychiatric history. She had two previous manic episodes 20 and 12 years ago. In 2006 she was hospitalized at [facility] with Dr. 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 Both times Lithium has been helpful and the patient did not take any medications in between episodes. There were no suicide attempts.  Family psychiatric history. Yesterday she reported that her mother and grandmother had bipolar disorder. Today she corrects it stating that they have depression.   Social history. She is married and lives with her husband and two teenage children. 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    Principal Problem: Bipolar affective disorder, current episode manic (HCC) Diagnosis:   Patient Active Problem List   Diagnosis Date Noted  . Lithium overdose [T56.891A] 01/29/2017  . Bipolar affective disorder, current episode manic (HCC) [F31.9] 01/29/2017  . Abnormal vaginal bleeding [N93.9] 12/13/2010   Total Time spent with patient: 30 minutes   Past Medical History:  Past Medical History:  Diagnosis Date  . Asthma    exercise induced    Past Surgical History:  Procedure Laterality Date  . CESAREAN SECTION     x 2   Social History:  History  Alcohol use Not on file     History  Drug Use No  Social History   Social History  . Marital status: Married    Spouse name: N/A  . Number of children: N/A  . Years of education: N/A   Social History Main Topics  . Smoking status: Never Smoker  . Smokeless tobacco: Never Used  . Alcohol use None  . Drug use: No  . Sexual activity: Yes     Birth control/ protection: Surgical   Other Topics Concern  . None   Social History Narrative  . None   Sleep: Good  Appetite:  Good  Current Medications: Current Facility-Administered Medications  Medication Dose Route Frequency Provider Last Rate Last Dose  . acetaminophen (TYLENOL) tablet 650 mg  650 mg Oral Q6H PRN [name], [name], MD      . alum & mag hydroxide-simeth (MAALOX/MYLANTA) 200-200-20 MG/5ML suspension 30 mL  30 mL Oral Q4H PRN [name], [name], MD      . lithium carbonate (LITHOBID) CR tablet 300 mg  300 mg Oral QPC breakfast [name], MD   300 mg at 02/01/17 1005  . lithium carbonate (LITHOBID) CR tablet 600 mg  600 mg Oral QHS [name], MD   600 mg at 01/31/17 2114  . magnesium hydroxide (MILK OF MAGNESIA) suspension 30 mL  30 mL Oral Daily PRN [name], [name], MD      . QUEtiapine (SEROQUEL) tablet 150 mg  150 mg Oral QHS [name], MD      . temazepam (RESTORIL) capsule 15 mg  15 mg Oral QHS PRN [name], [name], MD   15 mg at 02/01/17 0031    Lab Results:  Results for orders placed or performed during the hospital encounter of 01/29/17 (from the past 48 hour(s))  Lithium level     Status: None   Collection Time: 01/31/17  7:20 AM  Result Value Ref Range   Lithium Lvl 0.61 0.60 - 1.20 mmol/L    Blood Alcohol level:  Lab Results  Component Value Date   ETH <10 01/28/2017    Metabolic Disorder Labs: Lab Results  Component Value Date   HGBA1C 5.3 01/30/2017   MPG 105.41 01/30/2017   No results found for: PROLACTIN Lab Results  Component Value Date   CHOL 169 01/30/2017   TRIG 73 01/30/2017   HDL 50 01/30/2017   CHOLHDL 3.4 01/30/2017   VLDL 15 01/30/2017   LDLCALC 104 (H) 01/30/2017    Physical Findings: AIMS:  , ,  ,  ,    CIWA:    COWS:     Musculoskeletal: Strength & Muscle Tone: within normal limits Gait & Station: normal Patient leans: N/A  Psychiatric Specialty Exam: Physical Exam  Psychiatric: Judgment and  thought content normal. Her speech is delayed. She is slowed. Cognition and memory are normal. She exhibits a depressed mood.    Review of Systems  Constitutional: Negative.   HENT: Negative.   Eyes: Negative.   Respiratory: Negative.   Cardiovascular: Negative.   Gastrointestinal: Negative.   Genitourinary: Negative.   Musculoskeletal: Negative.   Skin: Negative.   Neurological: Negative.   Endo/Heme/Allergies: Negative.     Blood pressure 119/81, pulse 97, temperature 98.3 F (36.8 C), temperature source Oral, resp. rate 18, height  (1.676 m), weight 84.4 kg (186 lb), SpO2 98 %.Body mass index is 30.02 kg/m.  General Appearance: Casual  Eye Contact:  Good  Speech:  Clear and Coherent and Slow  Volume:  Normal  Mood:  Depressed  Affect:  Anxious  Thought Process:  Goal Directed and Linear  Orientation:  Full (Time, Place, and Person)  Thought Content:  Logical  Suicidal Thoughts:  No  Homicidal Thoughts:  No  Memory:  Immediate;   Good Recent;   Good Remote;   Good  Judgement:  Other:  Good by testing, recently not good with overdose  Insight:  Good  Psychomotor Activity:  Normal  Concentration:  Concentration: Fair and Attention Span: Fair  Recall:  Good  Fund of Knowledge:  Good  Language:  Good  Akathisia:  No  Handed:  Right  AIMS (if indicated):     Assets:  Architect Housing Physical Health Social Support Talents/Skills Transportation Vocational/Educational  ADL's:  Intact  Cognition:  WNL  Sleep:  Number of Hours: 6     Treatment Plan Summary:  Bipolar Disorder,MRE Depressed  Ms. Kelner is a 46 year old female with a istory of bipolar illness admitted for Lithium overdose while manic.  1. Suicidal ideation. The patient adamantly denies any thoughts, intention or plans to hurt herself or others. She is able to contract for safety in the hospital.  2. Mood and psychosis. Lithium level was 0.60.  Lithium was restarted at 300 mg by mouth twice a day we'll plan to increase lithium to a total of 300 mg by mouth daily in the morning and 600 mg at bedtime. Patient was also started on Seroquel 100 mg by mouth nightly for mood stabilization and will increase to  po nightly for anxiety and mood stabilization. Hemoglobin A1c was 5.3 total cholesterol is 169. EKG showed a QTC of 435.  3. Insomnia. She took Restoril last night but her sleep was interrupted.   5. Metabolic syndrome monitoring. Lipid panel, TSH and HgbA1C were within normal limits  6. EKG. shows EKG showed a QTc interval 435.  7. Pregnancy test is negative.  8. Disposition. She will be discharged to home with family. She will need to follow up for psychotropic medication management after discharge.   Daily contact with patient to assess and evaluate symptoms and progress in treatment and Medication management  Levora Angel, MD 02/01/2017, 4:43 PM

## 2017-02-02 ENCOUNTER — Inpatient Hospital Stay: Payer: BLUE CROSS/BLUE SHIELD

## 2017-02-02 MED ORDER — ZIPRASIDONE HCL 40 MG PO CAPS
40.0000 mg | ORAL_CAPSULE | Freq: Two times a day (BID) | ORAL | Status: DC
  Administered 2017-02-02 – 2017-02-03 (×3): 40 mg via ORAL
  Filled 2017-02-02 (×3): qty 1

## 2017-02-02 NOTE — Progress Notes (Signed)
[facility] MD Progress Note  02/02/2017 2:03 PM Angela Vazguez  MRN:  [id number]   Subjective:   Ms. Otoole is a 46 year old female with a history of bipolar disorder admitted after overdose on lithium.  02/02/2017. The patient reports very little improvement over the weekend. She is unable to express herselfat all. He is unable to describe her mood or talk about the reasons for her hospitalization. She seems disorganized but also very hesitant in her speech. She accepts medication and tolerates them well but did not notice any positive changes. And has been visited over the weekend and he is absolutely convinced that the patient tried to kill herself with lithium overdose. She seems confused but pleasant. She is unable to plan her activities on the unit. Nursing report the patient takes medications as prescribed. He goes to groups that can hardly participate.  Treatment plan.The patient was restarted on the lithium that was helpful in the past and Seroquel for mood stabilization. She did not improve at all. We will substitute Seroquel with Geodon. Lithium level is 0.61. We will order a head CT scan to rule out stroke.  Past psychiatric history. The patient had 2 manic episodes 12 and 20 years ago. Both times she responded well to lithium. There were no suicide attempts.  Family psychiatric history.Grandmother and mother with depression.  Social history. The patient lives with her 2 teenage children.  Principal Problem: Bipolar affective disorder, current episode manic (HCC) Diagnosis:   Patient Active Problem List   Diagnosis Date Noted  . Lithium overdose [T56.891A] 01/29/2017  . Bipolar affective disorder, current episode manic (HCC) [F31.9] 01/29/2017  . Abnormal vaginal bleeding [N93.9] 12/13/2010   Total Time spent with patient: 30 minutes   Past Medical History:  Past Medical History:  Diagnosis Date  . Asthma    exercise induced    Past Surgical History:  Procedure  Laterality Date  . CESAREAN SECTION     x 2   Social History:  History  Alcohol use Not on file     History  Drug Use No    Social History   Social History  . Marital status: Married    Spouse name: N/A  . Number of children: N/A  . Years of education: N/A   Social History Main Topics  . Smoking status: Never Smoker  . Smokeless tobacco: Never Used  . Alcohol use None  . Drug use: No  . Sexual activity: Yes    Birth control/ protection: Surgical   Other Topics Concern  . None   Social History Narrative  . None   Sleep: Fair  Appetite:  Fair  Current Medications: Current Facility-Administered Medications  Medication Dose Route Frequency Provider Last Rate Last Dose  . acetaminophen (TYLENOL) tablet 650 mg  650 mg Oral Q6H PRN [name], [name], MD      . alum & mag hydroxide-simeth (MAALOX/MYLANTA) 200-200-20 MG/5ML suspension 30 mL  30 mL Oral Q4H PRN [name], [name], MD      . lithium carbonate (LITHOBID) CR tablet 300 mg  300 mg Oral QPC breakfast [name], MD   300 mg at 02/02/17 0912  . lithium carbonate (LITHOBID) CR tablet 600 mg  600 mg Oral QHS [name], MD   600 mg at 02/01/17 2122  . magnesium hydroxide (MILK OF MAGNESIA) suspension 30 mL  30 mL Oral Daily PRN [name], [name], MD      . temazepam (RESTORIL) capsule 15 mg  15  mg Oral QHS PRN [name], [name], MD   15 mg at 02/01/17 0031  . ziprasidone (GEODON) capsule 40 mg  40 mg Oral BID WC Illias Pantano B, MD        Lab Results:  No results found for this or any previous visit (from the past 48 hour(s)).  Blood Alcohol level:  Lab Results  Component Value Date   ETH <10 01/28/2017    Metabolic Disorder Labs: Lab Results  Component Value Date   HGBA1C 5.3 01/30/2017   MPG 105.41 01/30/2017   No results found for: PROLACTIN Lab Results  Component Value Date   CHOL 169 01/30/2017   TRIG 73 01/30/2017   HDL 50 01/30/2017   CHOLHDL 3.4 01/30/2017   VLDL 15 01/30/2017    LDLCALC 104 (H) 01/30/2017    Physical Findings: AIMS:  , ,  ,  ,    CIWA:    COWS:     Musculoskeletal: Strength & Muscle Tone: within normal limits Gait & Station: normal Patient leans: N/A  Psychiatric Specialty Exam: Physical Exam  Nursing note and vitals reviewed. Psychiatric: Her behavior is normal. Judgment and thought content normal. Her mood appears anxious. Her speech is delayed. Cognition and memory are normal.    Review of Systems  Neurological: Positive for speech change.  Psychiatric/Behavioral: The patient is nervous/anxious.   All other systems reviewed and are negative.   Blood pressure 120/76, pulse 93, temperature 98.4 F (36.9 C), temperature source Oral, resp. rate 20, height  (1.676 m), weight 84.4 kg (186 lb), SpO2 98 %.Body mass index is 30.02 kg/m.  General Appearance: Casual  Eye Contact:  Good  Speech:  Clear and Coherent and Slow  Volume:  Normal  Mood:  Anxious  Affect:  Anxious  Thought Process:  Goal Directed and Descriptions of Associations: Intact  Orientation:  Full (Time, Place, and Person)  Thought Content:  WDL  Suicidal Thoughts:  No  Homicidal Thoughts:  No  Memory:  Immediate;   Fair Recent;   Fair Remote;   Fair  Judgement:  Impaired  Insight:  Good and Shallow  Psychomotor Activity:  Normal  Concentration:  Concentration: Fair and Attention Span: Fair  Recall:  [employer] of Knowledge:  Fair  Language:  Fair  Akathisia:  No  Handed:  Right  AIMS (if indicated):     Assets:  Communication Skills Desire for Improvement Financial Resources/Insurance Housing Physical Health Resilience Social Support Talents/Skills Transportation  ADL's:  Intact  Cognition:  WNL  Sleep:  Number of Hours: 7.45     Treatment Plan Summary:  Bipolar Disorder,MRE Depressed  Ms. Templeman is a 46 year old female with a history of bipolar illness admitted for Lithium overdose.  1. Suicidal ideation. Resolved. The patient denies  any thoughts, intentions, or plans to hurt herself or others she is able to contract for safety.    2. Mood and psychosis. We will increase lithium to 900 mg daily as well as lithium level was 0.061 for mood stabilization. We discontinued Seroquel and started Geodon 40 mg twice daily. Will increase to 80 mg twice daily tomorrow.  3. Insomnia. Restoril is available.  4. Metabolic syndrome monitoring. Lipid panel, TSH, hemoglobin A1c are normal.  5. EKG. Normal sinus rhythm, QTC 435.  6. Pregnancy test is negative.  7. Head CT scan. Pending.  8.  disposition. She will be discharged to home with family. She will follow up with mental health professional for medication management.  Daily contact with patient to assess and evaluate symptoms and progress in treatment and Medication management  Kristine Linea, MD 02/02/2017, 2:03 PM

## 2017-02-02 NOTE — Plan of Care (Signed)
Problem: Activity: Goal: Interest or engagement in leisure activities will improve Patient will be able to attend groups and participate by day 2 of admission  Outcome: Not Progressing Patient is still a little guarded when approached when being talked with by this writer  Problem: Coping: Goal: Ability to verbalize feelings will improve Outcome: Not Progressing Patient is not forthcoming with her conversation about her hospital stay remains guarded

## 2017-02-02 NOTE — [facility] Group Notes (Signed)
LCSW Group Therapy Note   02/02/2017 9:30am   Type of Therapy and Topic:  Group Therapy:  Overcoming Obstacles   Participation Level:  Minimal   Description of Group:    In this group patients will be encouraged to explore what they see as obstacles to their own wellness and recovery. They will be guided to discuss their thoughts, feelings, and behaviors related to these obstacles. The group will process together ways to cope with barriers, with attention given to specific choices patients can make. Each patient will be challenged to identify changes they are motivated to make in order to overcome their obstacles. This group will be process-oriented, with patients participating in exploration of their own experiences as well as giving and receiving support and challenge from other group members.   Therapeutic Goals: 1. Patient will identify personal and current obstacles as they relate to admission. 2. Patient will identify barriers that currently interfere with their wellness or overcoming obstacles.  3. Patient will identify feelings, thought process and behaviors related to these barriers. 4. Patient will identify two changes they are willing to make to overcome these obstacles:      Summary of Patient Progress Able to meet therapeutic goals listed above.  Pt more reserved than in previous group meetings.  She verbalizes that her goal is to return home as she is needed in taking care of her family. Group discussed the importance of self-care first and the way that care giving can be motivation to stay well but also be very stressful and create opportunities that make self-care challenging.     Therapeutic Modalities:   Cognitive Behavioral Therapy Solution Focused Therapy Motivational Interviewing Relapse Prevention Therapy  Glennon Mac, LCSW 02/02/2017 4:51 PM

## 2017-02-02 NOTE — Progress Notes (Signed)
D: Pt denies SI/HI/AVH, affect is flat and sad, denies pain or discomfort. Pt is pleasant and cooperative, thoughts are organized and coherent with delayed response noted. Patient  appears less anxious, isolates to the room except during medication pass.  A: Pt was offered support and encouragement. Pt was given scheduled medications. Pt was encouraged to attend groups. Q 15 minute checks were done for safety.  R: Pt did not attend evening wrap up group. Pt is complaint with medication. Pt is receptive to treatment and safety maintained on unit, will continue to closely monitor.

## 2017-02-02 NOTE — Plan of Care (Signed)
Problem: Education: Goal: Utilization of techniques to improve thought processes will improve Patient will learn skills to cope with her stressors while hospitalized  Outcome: Progressing During visit with Mother and Spouse, Patient asked for writer "to answer questions from family".  Patient gave verbal consent for writer to answer questions.  Family basically wanted a review of the plan of care which writer will relay to team.  Patient's husband asked questions about length of stay, medications and Lithium blood work.  Writer provided a review of Lithium and Seroquel along with basic information on depression, Lithium and thyroid.  Patient was relaxed during education and demonstrated understanding.    Problem: Coping: Goal: Ability to cope will improve Outcome: Progressing Patient continues to be out in the dayroom and is observed engaging with peers in games.  She denies SI/HI/AV/H and contracts for safety. Patient was visited by her Spouse and Mother this evening. Goal: Ability to verbalize feelings will improve Outcome: Progressing Patient asked questions about medications and was given information on the changes in doses.  She was appreciative of information.  She continues to not easily identify feelings.

## 2017-02-03 LAB — THYROID PANEL WITH TSH
FREE THYROXINE INDEX: 2.5 (ref 1.2–4.9)
T3 UPTAKE RATIO: 31 % (ref 24–39)
T4, Total: 8.2 ug/dL (ref 4.5–12.0)
TSH: 3.97 u[IU]/mL (ref 0.450–4.500)

## 2017-02-03 MED ORDER — ZIPRASIDONE HCL 40 MG PO CAPS
80.0000 mg | ORAL_CAPSULE | Freq: Two times a day (BID) | ORAL | Status: DC
  Administered 2017-02-04 – 2017-02-05 (×4): 80 mg via ORAL
  Filled 2017-02-03 (×4): qty 2

## 2017-02-03 NOTE — [facility] Group Notes (Signed)
[facility] LCSW Group Therapy Note  Date/Time: 02/03/17, 0930  Type of Therapy/Topic:  Group Therapy:  Feelings about Diagnosis  Participation Level:  Minimal   Mood: pleasant   Description of Group:    This group will allow patients to explore their thoughts and feelings about diagnoses they have received. Patients will be guided to explore their level of understanding and acceptance of these diagnoses. Facilitator will encourage patients to process their thoughts and feelings about the reactions of others to their diagnosis, and will guide patients in identifying ways to discuss their diagnosis with significant others in their lives. This group will be process-oriented, with patients participating in exploration of their own experiences as well as giving and receiving support and challenge from other group members.   Therapeutic Goals: 1. Patient will demonstrate understanding of diagnosis as evidence by identifying two or more symptoms of the disorder:  2. Patient will be able to express two feelings regarding the diagnosis 3. Patient will demonstrate ability to communicate their needs through discussion and/or role plays  Summary of Patient Progress: Pt identified her diagnosis as bipolar disorder but did participate in the group discussion very much.          Therapeutic Modalities:   Cognitive Behavioral Therapy Brief Therapy Feelings Identification   Daleen Squibb, LCSW

## 2017-02-03 NOTE — Progress Notes (Signed)
[facility] MD Progress Note  02/03/2017 8:38 PM Necha Harries  MRN:  [id number]  Subjective: Ms. Graciella Belton has a history of psychotic episodes, likely mania, admitted for anothet psychotic break.  Today Ms. Valade is very tearful and asking to go home. She did not improve at all. She is still depressed with flat affect. She denies suicidal ideation unconvincingly. There are no psychotic features. She accepts and tolerates imedications well.   Treatment plan. We will continue Lithium and Geodon.  Past psychiatric history. Two earlier episodes of psychosis.  Family psychiatric history. Bipolar.  Socia/disposition. She will return home with family and follow up with Dr. Maryruth Bun.   Principal Problem: Bipolar affective disorder, current episode manic (HCC) Diagnosis:   Patient Active Problem List   Diagnosis Date Noted  . Lithium overdose [T56.891A] 01/29/2017  . Bipolar affective disorder, current episode manic (HCC) [F31.9] 01/29/2017  . Abnormal vaginal bleeding [N93.9] 12/13/2010   Total Time spent with patient: 30 minutes  Past Medical History:  Past Medical History:  Diagnosis Date  . Asthma    exercise induced    Past Surgical History:  Procedure Laterality Date  . CESAREAN SECTION     x 2   Family History: History reviewed. No pertinent family history.  Social History:  History  Alcohol use Not on file     History  Drug Use No    Social History   Social History  . Marital status: Married    Spouse name: N/A  . Number of children: N/A  . Years of education: N/A   Social History Main Topics  . Smoking status: Never Smoker  . Smokeless tobacco: Never Used  . Alcohol use None  . Drug use: No  . Sexual activity: Yes    Birth control/ protection: Surgical   Other Topics Concern  . None   Social History Narrative  . None   Additional Social History:                         Sleep: Fair  Appetite:  Fair  Current Medications: Current  Facility-Administered Medications  Medication Dose Route Frequency Provider Last Rate Last Dose  . acetaminophen (TYLENOL) tablet 650 mg  650 mg Oral Q6H PRN [name], [name], MD      . alum & mag hydroxide-simeth (MAALOX/MYLANTA) 200-200-20 MG/5ML suspension 30 mL  30 mL Oral Q4H PRN [name], [name], MD      . lithium carbonate (LITHOBID) CR tablet 300 mg  300 mg Oral QPC breakfast [name], MD   300 mg at 02/03/17 0835  . lithium carbonate (LITHOBID) CR tablet 600 mg  600 mg Oral QHS [name], MD   600 mg at 02/02/17 2200  . magnesium hydroxide (MILK OF MAGNESIA) suspension 30 mL  30 mL Oral Daily PRN [name], [name], MD      . temazepam (RESTORIL) capsule 15 mg  15 mg Oral QHS PRN [name], [name], MD   15 mg at 02/02/17 2158  . ziprasidone (GEODON) capsule 40 mg  40 mg Oral BID WC Garyn Arlotta B, MD   40 mg at 02/03/17 1702    Lab Results:  Results for orders placed or performed during the hospital encounter of 01/29/17 (from the past 48 hour(s))  Thyroid Panel With TSH     Status: None   Collection Time: 02/02/17  7:10 AM  Result Value Ref Range   TSH 3.970 0.450 - 4.500  uIU/mL   T4, Total 8.2 4.5 - 12.0 ug/dL   T3 Uptake Ratio 31 24 - 39 %   Free Thyroxine Index 2.5 1.2 - 4.9    Comment: (NOTE) Performed At: [facility] [address] [place], [place] [id number] Mila Homer MD WU:[id number]     Blood Alcohol level:  Lab Results  Component Value Date   [facility] <10 01/28/2017    Metabolic Disorder Labs: Lab Results  Component Value Date   HGBA1C 5.3 01/30/2017   MPG 105.41 01/30/2017   No results found for: PROLACTIN Lab Results  Component Value Date   CHOL 169 01/30/2017   TRIG 73 01/30/2017   HDL 50 01/30/2017   CHOLHDL 3.4 01/30/2017   VLDL 15 01/30/2017   LDLCALC 104 (H) 01/30/2017    Physical Findings: AIMS:  , ,  ,  ,    CIWA:    COWS:     Musculoskeletal: Strength & Muscle Tone: within normal limits Gait & Station:  normal Patient leans: N/A  Psychiatric Specialty Exam: Physical Exam  Nursing note and vitals reviewed. Psychiatric: Her mood appears anxious. Her speech is delayed. She is slowed. Cognition and memory are impaired. She expresses impulsivity.    Review of Systems  Psychiatric/Behavioral: Positive for depression and memory loss.  All other systems reviewed and are negative.   Blood pressure 129/85, pulse (!) 101, temperature 98.4 F (36.9 C), resp. rate 20, height  (1.676 m), weight 84.4 kg (186 lb), SpO2 98 %.Body mass index is 30.02 kg/m.  General Appearance: Casual  Eye Contact:  Good  Speech:  Slow  Volume:  Normal  Mood:  Anxious and Depressed  Affect:  Blunt  Thought Process:  Disorganized and Descriptions of Associations: Intact  Orientation:  Full (Time, Place, and Person)  Thought Content:  Illogical  Suicidal Thoughts:  No  Homicidal Thoughts:  No  Memory:  Immediate;   Fair Recent;   Fair Remote;   Fair  Judgement:  Impaired  Insight:  Shallow  Psychomotor Activity:  Decreased  Concentration:  Concentration: Fair and Attention Span: Fair  Recall:  [employer] of Knowledge:  Fair  Language:  Fair  Akathisia:  No  Handed:  Right  AIMS (if indicated):     Assets:  Communication Skills Desire for Improvement Financial Resources/Insurance Housing Physical Health Resilience Social Support  ADL's:  Intact  Cognition:  WNL  Sleep:  Number of Hours: 8     Treatment Plan Summary: Daily contact with patient to assess and evaluate symptoms and progress in treatment and Medication management   Ms. Weinheimer is a 46 year old female with a history of bipolar illness admitted for Lithium overdose.  1. Suicidal ideation. Resolved. The patient denies any thoughts, intentions, or plans to hurt herself or others she is able to contract for safety.    2. Mood and psychosis. We increased lithium to 900 mg daily, lithium level in am, and Geodon to 8o mg for  psychosis.   to 80 mg bid for psychosis.  3. Insomnia. Restoril is available.  4. Metabolic syndrome monitoring. Lipid panel, TSH, hemoglobin A1c are normal.  5. EKG. Normal sinus rhythm, QTC 435.  6. Pregnancy test is negative.  7. Head CT scan. Pending.  8.  D isposition. She will be discharged to home with family. She will follow up with mental health professional for medication management.   Kristine Linea, MD 02/03/2017, 8:38 PM

## 2017-02-03 NOTE — Plan of Care (Signed)
Problem: Coping: Goal: Ability to verbalize feelings will improve Outcome: Progressing Patient verbalized feelings to staff.    

## 2017-02-03 NOTE — Progress Notes (Signed)
CSW spoke with husband, Ward.  He has been by to visit each night and talks to pt on the phone as well.  When pt had a similar episode 12 years ago, she also had trouble putting her thoughts together and answering questions.  Ward reports he has seen this on some of his visits and on others she has been better.  He agrees that she is not ready for discharge and will continue to visit and give feedback. Garner Nash, MSW, LCSW Clinical Social Worker 02/03/2017 3:33 PM

## 2017-02-03 NOTE — Plan of Care (Signed)
Problem: Activity: Goal: Interest or engagement in leisure activities will improve Patient will be able to attend groups and participate by day 2 of admission  Outcome: Progressing Patient is attending group   Problem: Education: Goal: Utilization of techniques to improve thought processes will improve Patient will learn skills to cope with her stressors while hospitalized  Outcome: Progressing Patient able to identify she needs to take on one task at a time and not get overwhelmed  Problem: Coping: Goal: Ability to cope will improve Outcome: Progressing Patient has adjusted to unit and she has required no PRN medication   Problem: Safety: Goal: Ability to disclose and discuss suicidal ideas will improve Outcome: Progressing Patient denies SI/HI. Patient remains safe on unit

## 2017-02-04 LAB — LITHIUM LEVEL: Lithium Lvl: 0.6 mmol/L (ref 0.60–1.20)

## 2017-02-04 LAB — VITAMIN B12: Vitamin B-12: 216 pg/mL (ref 180–914)

## 2017-02-04 MED ORDER — LITHIUM CARBONATE ER 300 MG PO TBCR
600.0000 mg | EXTENDED_RELEASE_TABLET | Freq: Two times a day (BID) | ORAL | Status: DC
  Administered 2017-02-04 – 2017-02-06 (×4): 600 mg via ORAL
  Filled 2017-02-04 (×4): qty 2

## 2017-02-04 MED ORDER — CYANOCOBALAMIN 1000 MCG/ML IJ SOLN
1000.0000 ug | Freq: Every day | INTRAMUSCULAR | Status: AC
  Administered 2017-02-04 – 2017-02-06 (×3): 1000 ug via INTRAMUSCULAR
  Filled 2017-02-04 (×4): qty 1

## 2017-02-04 NOTE — Tx Team (Signed)
Interdisciplinary Treatment and Diagnostic Plan Update  02/04/2017 Time of Session: 1115 Sarah Dixon MRN: [id number]  Principal Diagnosis: Bipolar affective disorder, current episode manic (HCC)  Secondary Diagnoses: Principal Problem:   Bipolar affective disorder, current episode manic (HCC) Active Problems:   Lithium overdose   Current Medications:  Current Facility-Administered Medications  Medication Dose Route Frequency Provider Last Rate Last Dose  . acetaminophen (TYLENOL) tablet 650 mg  650 mg Oral Q6H PRN [name], [name], MD      . alum & mag hydroxide-simeth (MAALOX/MYLANTA) 200-200-20 MG/5ML suspension 30 mL  30 mL Oral Q4H PRN [name], [name], MD      . lithium carbonate (LITHOBID) CR tablet 300 mg  300 mg Oral QPC breakfast [name], MD   300 mg at 02/04/17 0846  . lithium carbonate (LITHOBID) CR tablet 600 mg  600 mg Oral QHS [name], MD   600 mg at 02/03/17 2151  . magnesium hydroxide (MILK OF MAGNESIA) suspension 30 mL  30 mL Oral Daily PRN [name], [name], MD      . temazepam (RESTORIL) capsule 15 mg  15 mg Oral QHS PRN [name], [name], MD   15 mg at 02/03/17 2151  . ziprasidone (GEODON) capsule 80 mg  80 mg Oral BID WC Pucilowska, Jolanta B, MD   80 mg at 02/04/17 0845   PTA Medications: Prescriptions Prior to Admission  Medication Sig Dispense Refill Last Dose  . aspirin-acetaminophen-caffeine (EXCEDRIN MIGRAINE) 250-250-65 MG per tablet Take 2 tablets by mouth as needed. For headache pain    prn at prn  . ibuprofen (ADVIL,MOTRIN) 200 MG tablet Take 600 mg by mouth as needed. For pain    prn at prn  . lithium 300 MG tablet Take 300 mg by mouth 2 (two) times daily.  3 Unknown at Unknown    Patient Stressors: Occupational concerns Other: lack of sleep  Patient Strengths: Ability for insight Average or above average intelligence Motivation for treatment/growth Supportive family/friends  Treatment Modalities: Medication Management,  Group therapy, Case management,  1 to 1 session with clinician, Psychoeducation, Recreational therapy.   Physician Treatment Plan for Primary Diagnosis: Bipolar affective disorder, current episode manic (HCC) Long Term Goal(s): Improvement in symptoms so as ready for discharge NA   Short Term Goals: Ability to identify changes in lifestyle to reduce recurrence of condition will improve Ability to verbalize feelings will improve Ability to disclose and discuss suicidal ideas Ability to demonstrate self-control will improve Ability to identify and develop effective coping behaviors will improve Ability to maintain clinical measurements within normal limits will improve Compliance with prescribed medications will improve Ability to identify triggers associated with substance abuse/mental health issues will improve Ability to identify changes in lifestyle to reduce recurrence of condition will improve Ability to demonstrate self-control will improve Ability to identify triggers associated with substance abuse/mental health issues will improve  Medication Management: Evaluate patient's response, side effects, and tolerance of medication regimen.  Therapeutic Interventions: 1 to 1 sessions, Unit Group sessions and Medication administration.  Evaluation of Outcomes: Progressing  Physician Treatment Plan for Secondary Diagnosis: Principal Problem:   Bipolar affective disorder, current episode manic (HCC) Active Problems:   Lithium overdose  Long Term Goal(s): Improvement in symptoms so as ready for discharge NA   Short Term Goals: Ability to identify changes in lifestyle to reduce recurrence of condition will improve Ability to verbalize feelings will improve Ability to disclose and discuss suicidal ideas Ability to demonstrate self-control  will improve Ability to identify and develop effective coping behaviors will improve Ability to maintain clinical measurements within normal limits  will improve Compliance with prescribed medications will improve Ability to identify triggers associated with substance abuse/mental health issues will improve Ability to identify changes in lifestyle to reduce recurrence of condition will improve Ability to demonstrate self-control will improve Ability to identify triggers associated with substance abuse/mental health issues will improve     Medication Management: Evaluate patient's response, side effects, and tolerance of medication regimen.  Therapeutic Interventions: 1 to 1 sessions, Unit Group sessions and Medication administration.  Evaluation of Outcomes: Progressing   RN Treatment Plan for Primary Diagnosis: Bipolar affective disorder, current episode manic (HCC) Long Term Goal(s): Knowledge of disease and therapeutic regimen to maintain health will improve  Short Term Goals: Ability to verbalize feelings will improve, Ability to identify and develop effective coping behaviors will improve and Compliance with prescribed medications will improve  Medication Management: RN will administer medications as ordered by provider, will assess and evaluate patient's response and provide education to patient for prescribed medication. RN will report any adverse and/or side effects to prescribing provider.  Therapeutic Interventions: 1 on 1 counseling sessions, Psychoeducation, Medication administration, Evaluate responses to treatment, Monitor vital signs and CBGs as ordered, Perform/monitor CIWA, COWS, AIMS and Fall Risk screenings as ordered, Perform wound care treatments as ordered.  Evaluation of Outcomes: Progressing   LCSW Treatment Plan for Primary Diagnosis: Bipolar affective disorder, current episode manic (HCC) Long Term Goal(s): Safe transition to appropriate next level of care at discharge, Engage patient in therapeutic group addressing interpersonal concerns.  Short Term Goals: Engage patient in aftercare planning with  referrals and resources, Increase social support and Increase skills for wellness and recovery  Therapeutic Interventions: Assess for all discharge needs, 1 to 1 time with Social worker, Explore available resources and support systems, Assess for adequacy in community support network, Educate family and significant other(s) on suicide prevention, Complete Psychosocial Assessment, Interpersonal group therapy.  Evaluation of Outcomes: Progressing   Progress in Treatment: Attending groups: yes Participating in groups: yes Taking medication as prescribed: Yes. Toleration medication: Yes. Family/Significant other contact made: Yes, individual(s) contacted:  husband Patient understands diagnosis: Yes. Discussing patient identified problems/goals with staff: Yes. Medical problems stabilized or resolved: Yes. Denies suicidal/homicidal ideation: Yes. Issues/concerns per patient self-inventory: No. Other: none  New problem(s) identified: No, Describe:  none  New Short Term/Long Term Goal(s):Pt goal is to "go home quickly."  Discharge Plan or Barriers: Pt has been referred to Dr Maryruth Bun for outpt follow up.  Reason for Continuation of Hospitalization: Depression Medication stabilization  Estimated Length of Stay:3-5 days.  Attendees: Patient: 02/04/2017   Physician: Dr. Jennet Maduro, MD 02/04/2017   Nursing: Horton Marshall, RN 02/04/2017   RN Care Manager: 02/04/2017   Social Worker: Daleen Squibb, Jake Shark LCSW 02/04/2017   Recreational Therapist:  02/04/2017   Other:  02/04/2017   Other:  02/04/2017   Other: 02/04/2017          Scribe for Treatment Team: [name], LCSW 02/04/2017 4:02 PM

## 2017-02-04 NOTE — Progress Notes (Signed)
D: Pt A & O X4. Denies SI, HI, AVH and pain. Reporst fair sleep with fair appetite and low energy. Visible in milieu for meals, medications and scheduled groups, observed slowly pacing in hall at intervals. Minimal but appropriate interactions noted with peers and staff. Rates her depression 3/10, hopelessness 2/10 and anxiety 2/10. Brightens up on interaction and forwards a little during conversations but remains guarded with depressed mood and affect. A: Scheduled medications administered as prescribed with verbal education and effects monitored. Emotional support and availability provided to pt. Pt encouraged to voice concerns and comply with scheduled unit groups. Writer updated pt on medication changes per MD's orders.  Routine safety checks maintained without self harm gestures or outburst.  R: Pt receptive to care. Attended unit groups. Pt is in agreement with medications changes per MD and has been compliant. Tolerated all PO intake well. Cooperative with unit routines. Remain safe on unit. Denies concerns at this time.

## 2017-02-04 NOTE — Progress Notes (Signed)
[facility] MD Progress Note  02/04/2017 5:15 PM Sarah Dixon  MRN:  [id number]  Subjective: Sarah Dixon is a 46 year old female with history of bipolar disorder admitted after overdose on lithium in most likely mixed episode with a remarkable thought disorganization.  Today the patient shows no improvement. She is still very disorganized in her thinking and really unable to answer simple questions, uncertain of everything. She is very tearful and wanting to go home. She denies any symptoms of depression anxiety or psychosis. She denies suicidal or homicidal thinking but she is unable to explain how she overdose on massive amounts. Her husband remembers that the patient displayed similar behaviors 12 years ago when hospitalized in a similar scenario.  Treatment plan. The patient is maintained on the lithium that has been helpful in the past for mood stabilization and Geodon for psychosis.  Social/disposition. The patient will be returning home with family. She will follow-up with Dr.Kapur.  Past psychiatric history. The patient had 2 similar episodes 20 and 12 years ago from which she fully recovered. She was treated with lithium both times.  Family psychiatric history. Grandmother with bipolar.  Principal Problem: Bipolar affective disorder, current episode manic (HCC) Diagnosis:   Patient Active Problem List   Diagnosis Date Noted  . Lithium overdose [T56.891A] 01/29/2017  . Bipolar affective disorder, current episode manic (HCC) [F31.9] 01/29/2017  . Abnormal vaginal bleeding [N93.9] 12/13/2010   Total Time spent with patient: 30 minutes  Past Medical History:  Past Medical History:  Diagnosis Date  . Asthma    exercise induced    Past Surgical History:  Procedure Laterality Date  . CESAREAN SECTION     x 2   Family History: History reviewed. No pertinent family history.  Social History:  History  Alcohol use Not on file     History  Drug Use No    Social History    Social History  . Marital status: Married    Spouse name: N/A  . Number of children: N/A  . Years of education: N/A   Social History Main Topics  . Smoking status: Never Smoker  . Smokeless tobacco: Never Used  . Alcohol use None  . Drug use: No  . Sexual activity: Yes    Birth control/ protection: Surgical   Other Topics Concern  . None   Social History Narrative  . None   Additional Social History:                         Sleep: Fair  Appetite:  Fair  Current Medications: Current Facility-Administered Medications  Medication Dose Route Frequency Provider Last Rate Last Dose  . acetaminophen (TYLENOL) tablet 650 mg  650 mg Oral Q6H PRN [name], [name], MD      . alum & mag hydroxide-simeth (MAALOX/MYLANTA) 200-200-20 MG/5ML suspension 30 mL  30 mL Oral Q4H PRN [name], [name], MD      . lithium carbonate (LITHOBID) CR tablet 300 mg  300 mg Oral QPC breakfast [name], MD   300 mg at 02/04/17 0846  . lithium carbonate (LITHOBID) CR tablet 600 mg  600 mg Oral QHS [name], MD   600 mg at 02/03/17 2151  . magnesium hydroxide (MILK OF MAGNESIA) suspension 30 mL  30 mL Oral Daily PRN [name], [name], MD      . temazepam (RESTORIL) capsule 15 mg  15 mg Oral QHS PRN [name], [name], MD  15 mg at 02/03/17 2151  . ziprasidone (GEODON) capsule 80 mg  80 mg Oral BID WC Shawnita Krizek B, MD   80 mg at 02/04/17 0845    Lab Results:  Results for orders placed or performed during the hospital encounter of 01/29/17 (from the past 48 hour(s))  Lithium level     Status: None   Collection Time: 02/04/17  7:36 AM  Result Value Ref Range   Lithium Lvl 0.60 0.60 - 1.20 mmol/L  Vitamin B12     Status: None   Collection Time: 02/04/17  7:36 AM  Result Value Ref Range   Vitamin B-12 216 180 - 914 pg/mL    Comment: (NOTE) This assay is not validated for testing neonatal or myeloproliferative syndrome specimens for Vitamin B12 levels. Performed at [facility], [address] [place], [place] [address]     Blood Alcohol level:  Lab Results  Component Value Date   ETH <10 01/28/2017    Metabolic Disorder Labs: Lab Results  Component Value Date   HGBA1C 5.3 01/30/2017   MPG 105.41 01/30/2017   No results found for: PROLACTIN Lab Results  Component Value Date   CHOL 169 01/30/2017   TRIG 73 01/30/2017   HDL 50 01/30/2017   CHOLHDL 3.4 01/30/2017   VLDL 15 01/30/2017   LDLCALC 104 (H) 01/30/2017    Physical Findings: AIMS:  , ,  ,  ,    CIWA:    COWS:     Musculoskeletal: Strength & Muscle Tone: within normal limits Gait & Station: normal Patient leans: N/A  Psychiatric Specialty Exam: Physical Exam  Nursing note and vitals reviewed. Psychiatric: Her mood appears anxious. Her affect is blunt. Her speech is delayed. She is slowed and withdrawn. Cognition and memory are normal. She expresses impulsivity.    Review of Systems  Neurological: Negative.   Psychiatric/Behavioral:       Thought disorganization.  All other systems reviewed and are negative.   Blood pressure 120/83, pulse 88, temperature 98.4 F (36.9 C), temperature source Oral, resp. rate 18, height  (1.676 m), weight 84.4 kg (186 lb), SpO2 96 %.Body mass index is 30.02 kg/m.  General Appearance: Casual  Eye Contact:  Good  Speech:  Slow  Volume:  Decreased  Mood:  Depressed  Affect:  Tearful  Thought Process:  Disorganized and Descriptions of Associations: Loose  Orientation:  Full (Time, Place, and Person)  Thought Content:  WDL  Suicidal Thoughts:  No  Homicidal Thoughts:  No  Memory:  Immediate;   Fair Recent;   Fair Remote;   Fair  Judgement:  Poor  Insight:  Lacking  Psychomotor Activity:  Psychomotor Retardation  Concentration:  Concentration: Fair and Attention Span: Fair  Recall:  [employer] of Knowledge:  Fair  Language:  Fair  Akathisia:  No  Handed:  Right  AIMS (if indicated):     Assets:  Communication  Skills Desire for Improvement Financial Resources/Insurance Housing Physical Health Resilience Social Support  ADL's:  Intact  Cognition:  WNL  Sleep:  Number of Hours: 8.15     Treatment Plan Summary: Daily contact with patient to assess and evaluate symptoms and progress in treatment and Medication management   Ms. Abair is a 46 year old female with a history of bipolar illness admitted for Lithium overdose.  1. Suicidal ideation. Resolved. The patient denies any thoughts, intentions, or plans to hurt herself or others she is able to contract for safety.  2. Mood and psychosis. We will increase lithium to 600 mg twice daily as her Lithium level is still 0.6 /radiation. We'll increase Geodon to 80 mg twice daily for psychosis.  3. Insomnia. Restoril is available.  4. Metabolic syndrome monitoring. Lipid panel, TSH, hemoglobin A1c are normal.  5. EKG. Normal sinus rhythm, QTC 435.  6. Pregnancy test is negative.  7. Head CT scan. Is negative.  8. RPR and HIV testing are pending.  9. Vitamin B12 deficiency. We will start supplementation.  10. Disposition. She will be discharged to home with family. She will follow up with Dr. Maryruth Bun for medication management.    Kristine Linea, MD 02/04/2017, 5:15 PM

## 2017-02-04 NOTE — Progress Notes (Addendum)
D: Pt denies SI/HI/AVH, affect is flat and sad, denies pain or discomfort. Pt is pleasant and cooperative, thoughts are organized, coherent and logical with delayed response noted. Patient is guarded, minimal interaction with peers and staff, appears less anxious.    A: Pt was offered support and encouragement. Pt was given scheduled medications. Pt was encouraged to attend groups. Q 15 minute checks were done for safety.  R: Pt attended evening wrapup group and was appropriate with peers and staff.  Pt is complaint with medication, and  receptive to treatment.  Safety maintained on unit, will continue to closely monitor.

## 2017-02-05 LAB — HIV ANTIBODY (ROUTINE TESTING W REFLEX): HIV SCREEN 4TH GENERATION: NONREACTIVE

## 2017-02-05 LAB — RPR: RPR: NONREACTIVE

## 2017-02-05 NOTE — Progress Notes (Signed)
Affect constricted.  Smiles when engaged although smile does not reach her eyes.  Verbalizes that she is ready to go home.  Medication and group compliant.  Visible in the milieu.  Interacts with peers and staff appropriately.  Rates depression as a 3/10.  Appetite fair. Maintains personal care chores appropriately.  Safety rounds maintained.

## 2017-02-05 NOTE — [facility] Group Notes (Signed)
[facility] Group Notes:  (Nursing/MHT/Case Management/Adjunct)  Date:  02/05/2017  Time:  9:48 PM  Type of Therapy:  Group Therapy  Participation Level:  Did Not Attend  Participation Quality:  Was in meeting  Affect:Summary of Progress/Problems:  Mayra Neer 02/05/2017, 9:48 PM

## 2017-02-05 NOTE — Consult Note (Signed)
ECT: ECT consult for this 45 year old woman with a history of bipolar disorder currently in the hospital with depressive symptoms. Primary psychiatrist requested that I speak with patient. Patient was in her room sitting very still staring into space very little activity. Her speech shows a lot of blocking in her affect is anxious and dysphoric. Patient says she is not feeling much different than when she came into the hospital. Still feeling depressed still having some thought disorder.  Based on diagnosis of bipolar depression with persistent symptoms and recent life-threatening overdose patient would potentially be a very appropriate candidate for ECT. Discussed with her the nature of ECT it's utility some of the side effects and benefits and some details of treatment here in the hospital.  Patient was taken aback saying that she had not thought of this treatment before. Seemed quite anxious. Requested written information. At this point we would be unlikely to be able to begin treatment before Wednesday at the earliest. I will try to get some written information to her and follow-up after the weekend.

## 2017-02-05 NOTE — Plan of Care (Signed)
Problem: Coping: Goal: Ability to verbalize feelings will improve Outcome: Progressing Verbalizes feelings without any difficulty.     

## 2017-02-05 NOTE — [facility] Group Notes (Signed)
[facility] LCSW Group Therapy Note  Date/Time: 02/05/17, 0930  Type of Therapy/Topic:  Group Therapy:  Balance in Life  Participation Level:  moderate  Description of Group:    This group will address the concept of balance and how it feels and looks when one is unbalanced. Patients will be encouraged to process areas in their lives that are out of balance, and identify reasons for remaining unbalanced. Facilitators will guide patients utilizing problem- solving interventions to address and correct the stressor making their life unbalanced. Understanding and applying boundaries will be explored and addressed for obtaining  and maintaining a balanced life. Patients will be encouraged to explore ways to assertively make their unbalanced needs known to significant others in their lives, using other group members and facilitator for support and feedback.  Therapeutic Goals: 1. Patient will identify two or more emotions or situations they have that consume much of in their lives. 2. Patient will identify signs/triggers that life has become out of balance:  3. Patient will identify two ways to set boundaries in order to achieve balance in their lives:  4. 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  Pt took part in group discussion regarding recognizing and adjusting for areas that get out of balance.  Pt participation was better than it has typically been.          Therapeutic Modalities:   Cognitive Behavioral Therapy Solution-Focused Therapy Assertiveness Training  Daleen Squibb, [place]

## 2017-02-05 NOTE — Progress Notes (Signed)
[facility] MD Progress Note  02/05/2017 3:22 PM Sarah Dixon  MRN:  [id number]  Subjective: Sarah Dixon is a 46 year old female with a history of bipolar disorder admitted for psychotic episode that resulted in severe overdose on lithium.  Today the patient looks even more depressed and confused. She is in her bed with curtains drawn. She is not interested in talking to me. She denies suicidal or homicidal ideations but is really uncertain to answer any questions. She accepts medications. She denies any side effects or she is slightly sedated from Geodon. There are no somatic complaints. She tries to participate in programming..  Treatment plan.We will continue lithium for mood stabilization and Geodon for psychosis. The patient has not been improving. I will ask for ECT consult.  Social/disposition. She will be returning home with her family.  Past psychiatric history. There were 2 prior psychotic episodes 20 and 12 years ago that resolved with lithium.  Family psychiatric history. Grandmother with bipolar.  Principal Problem: Bipolar affective disorder, current episode manic (HCC) Diagnosis:   Patient Active Problem List   Diagnosis Date Noted  . Lithium overdose [T56.891A] 01/29/2017  . Bipolar affective disorder, current episode manic (HCC) [F31.9] 01/29/2017  . Abnormal vaginal bleeding [N93.9] 12/13/2010   Total Time spent with patient: 30 minutes  Past Medical History:  Past Medical History:  Diagnosis Date  . Asthma    exercise induced    Past Surgical History:  Procedure Laterality Date  . CESAREAN SECTION     x 2   Family History: History reviewed. No pertinent family history.  Social History:  History  Alcohol use Not on file     History  Drug Use No    Social History   Social History  . Marital status: Married    Spouse name: N/A  . Number of children: N/A  . Years of education: N/A   Social History Main Topics  . Smoking status: Never Smoker   . Smokeless tobacco: Never Used  . Alcohol use None  . Drug use: No  . Sexual activity: Yes    Birth control/ protection: Surgical   Other Topics Concern  . None   Social History Narrative  . None   Additional Social History:                         Sleep: Fair  Appetite:  Fair  Current Medications: Current Facility-Administered Medications  Medication Dose Route Frequency Provider Last Rate Last Dose  . acetaminophen (TYLENOL) tablet 650 mg  650 mg Oral Q6H PRN [name], [name], MD      . alum & mag hydroxide-simeth (MAALOX/MYLANTA) 200-200-20 MG/5ML suspension 30 mL  30 mL Oral Q4H PRN [name], [name], MD      . cyanocobalamin ((VITAMIN B-12)) injection 1,000 mcg  1,000 mcg Intramuscular Daily Demontray Franta B, MD   1,000 mcg at 02/05/17 0908  . lithium carbonate (LITHOBID) CR tablet 600 mg  600 mg Oral Q12H Karlina Suares B, MD   600 mg at 02/05/17 0907  . magnesium hydroxide (MILK OF MAGNESIA) suspension 30 mL  30 mL Oral Daily PRN [name], [name], MD      . temazepam (RESTORIL) capsule 15 mg  15 mg Oral QHS PRN [name], [name], MD   15 mg at 02/04/17 2236  . ziprasidone (GEODON) capsule 80 mg  80 mg Oral BID WC Beyla Loney B, MD   80 mg at 02/05/17 0907  Lab Results:  Results for orders placed or performed during the hospital encounter of 01/29/17 (from the past 48 hour(s))  Lithium level     Status: None   Collection Time: 02/04/17  7:36 AM  Result Value Ref Range   Lithium Lvl 0.60 0.60 - 1.20 mmol/L  RPR     Status: None   Collection Time: 02/04/17  7:36 AM  Result Value Ref Range   RPR Ser Ql Non Reactive Non Reactive    Comment: (NOTE) Performed At: [facility] [address] [place], [place] [id number] Mila Homer MD [id number]   HIV antibody     Status: None   Collection Time: 02/04/17  7:36 AM  Result Value Ref Range   HIV Screen 4th Generation wRfx Non Reactive Non Reactive    Comment: (NOTE) Performed  At: [facility] [address] [place], [place] [id number] Mila Homer MD [id number]   Vitamin B12     Status: None   Collection Time: 02/04/17  7:36 AM  Result Value Ref Range   Vitamin B-12 216 180 - 914 pg/mL    Comment: (NOTE) This assay is not validated for testing neonatal or myeloproliferative syndrome specimens for Vitamin B12 levels. Performed at [facility], [address] [place], [place] [address]     Blood Alcohol level:  Lab Results  Component Value Date   ETH <10 01/28/2017    Metabolic Disorder Labs: Lab Results  Component Value Date   HGBA1C 5.3 01/30/2017   MPG 105.41 01/30/2017   No results found for: PROLACTIN Lab Results  Component Value Date   CHOL 169 01/30/2017   TRIG 73 01/30/2017   HDL 50 01/30/2017   CHOLHDL 3.4 01/30/2017   VLDL 15 01/30/2017   LDLCALC 104 (H) 01/30/2017    Physical Findings: AIMS:  , ,  ,  ,    CIWA:    COWS:     Musculoskeletal: Strength & Muscle Tone: within normal limits Gait & Station: normal Patient leans: N/A  Psychiatric Specialty Exam: Physical Exam  Nursing note and vitals reviewed. Psychiatric: Her affect is blunt. Her speech is delayed. She is slowed and withdrawn. Cognition and memory are impaired. She expresses impulsivity.    Review of Systems  Neurological: Negative.   Psychiatric/Behavioral: Positive for depression.  All other systems reviewed and are negative.   Blood pressure 125/88, pulse 91, temperature 98.7 F (37.1 C), temperature source Oral, resp. rate 16, height  (1.676 m), weight 84.4 kg (186 lb), SpO2 96 %.Body mass index is 30.02 kg/m.  General Appearance: Disheveled  Eye Contact:  Minimal  Speech:  Slow  Volume:  Decreased  Mood:  Depressed  Affect:  Congruent  Thought Process:  Disorganized and Descriptions of Associations: Loose  Orientation:  Full (Time, Place, and Person)  Thought Content:  WDL  Suicidal Thoughts:  No  Homicidal Thoughts:   No  Memory:  Immediate;   Fair Recent;   Fair Remote;   Fair  Judgement:  Poor  Insight:  Lacking  Psychomotor Activity:  Psychomotor Retardation  Concentration:  Concentration: Fair and Attention Span: Fair  Recall:  [employer] of Knowledge:  Fair  Language:  Fair  Akathisia:  No  Handed:  Right  AIMS (if indicated):     Assets:  Communication Skills Desire for Improvement Financial Resources/Insurance Housing Intimacy Resilience Social Support Transportation Vocational/Educational  ADL's:  Intact  Cognition:  WNL  Sleep:  Number of Hours: 7  Treatment Plan Summary: Daily contact with patient to assess and evaluate symptoms and progress in treatment and Medication management   Ms. Goehring is a 46 year old female with a history of bipolar illness admitted for Lithium overdose.  1. Suicidal ideation. Resolved. The patient denies any thoughts, intentions, or plans to hurt herself or others she is able to contract for safety.   2. Mood and psychosis. We'll increase lithium to 600 mg twice daily and lithium level was still 0.6 for mood stabilization. We'll increase Geodon to 80 mg twice daily for disorganized thoughts. I will ask Dr. Toni Amend for ECT consult as the patient is not making much progress.   3. Insomnia. Restoril is available.  4. Metabolic syndrome monitoring. Lipid panel, TSH, hemoglobin A1c are normal.  5. EKG. Normal sinus rhythm, QTC 435.  6. Pregnancy test is negative.  7. Head CT scan. Is negative.  8. RPR and HIV testing are pending.  9. Vitamin B12 deficiency. We will start supplementation.  10. Disposition. She will be discharged to home with family. She will follow up with Dr. Maryruth Bun for medication management.    Kristine Linea, MD 02/05/2017, 3:22 PM

## 2017-02-05 NOTE — Plan of Care (Signed)
Problem: Activity: Goal: Interest or engagement in leisure activities will improve Patient will be able to attend groups and participate by day 2 of admission  Outcome: Progressing Attending groups.  Up to day room.  Interacting with peers and staff appropriately.

## 2017-02-06 DIAGNOSIS — E538 Deficiency of other specified B group vitamins: Secondary | ICD-10-CM | POA: Diagnosis present

## 2017-02-06 LAB — BASIC METABOLIC PANEL
ANION GAP: 8 (ref 5–15)
BUN: 12 mg/dL (ref 6–20)
CHLORIDE: 106 mmol/L (ref 101–111)
CO2: 27 mmol/L (ref 22–32)
CREATININE: 0.72 mg/dL (ref 0.44–1.00)
Calcium: 9.3 mg/dL (ref 8.9–10.3)
GFR calc non Af Amer: >60 mL/min (ref >60)
Glucose, Bld: 100 mg/dL — ABNORMAL HIGH (ref 65–99)
POTASSIUM: 3.8 mmol/L (ref 3.5–5.1)
SODIUM: 141 mmol/L (ref 135–145)

## 2017-02-06 LAB — LITHIUM LEVEL: LITHIUM LVL: 0.69 mmol/L (ref 0.60–1.20)

## 2017-02-06 MED ORDER — LITHIUM CARBONATE ER 300 MG PO TBCR: 600.0000 mg | EXTENDED_RELEASE_TABLET | Freq: Two times a day (BID) | ORAL | 1 refills | Status: AC

## 2017-02-06 MED ORDER — VITAMIN B-12 1000 MCG PO TABS: 1000.0000 ug | ORAL_TABLET | Freq: Every day | ORAL | Status: DC

## 2017-02-06 MED ORDER — CYANOCOBALAMIN 1000 MCG PO TABS: 1000.0000 ug | ORAL_TABLET | Freq: Every day | ORAL | 1 refills | Status: AC

## 2017-02-06 NOTE — Plan of Care (Signed)
Problem: Activity: Goal: Interest or engagement in leisure activities will improve Patient will be able to attend groups and participate by day 2 of admission  Outcome: Progressing Attends groups.

## 2017-02-06 NOTE — B[facility] Group Notes (Signed)
LCSW Group Therapy Note  02/06/2017 9:30am  Type of Therapy and Topic:  Group Therapy:  Feelings around Relapse and Recovery  Participation Level:  Minimal   Description of Group:    Patients in this group will discuss emotions they experience before and after a relapse. They will process how experiencing these feelings, or avoidance of experiencing them, relates to having a relapse. Facilitator will guide patients to explore emotions they have related to recovery. Patients will be encouraged to process which emotions are more powerful. They will be guided to discuss the emotional reaction significant others in their lives may have to their relapse or recovery. Patients will be assisted in exploring ways to respond to the emotions of others without this contributing to a relapse.  Therapeutic Goals: 1. Patient will identify two or more emotions that lead to a relapse for them 2. Patient will identify two emotions that result when they relapse 3. Patient will identify two emotions related to recovery 4. Patient will demonstrate ability to communicate their needs through discussion and/or role plays   Summary of Patient Progress:  Pt participated in group, able to meet therapeutic goals with much encouragement. Verbalizes feeling better with recent medication adjustments.   Therapeutic Modalities:   Cognitive Behavioral Therapy Solution-Focused Therapy Assertiveness Training Relapse Prevention Therapy   Glennon Mac, LCSW 02/06/2017 11:28 AM

## 2017-02-06 NOTE — Progress Notes (Signed)
Per self inventory rates depression as 3/10.  Verbalizes to this Clinical research associate depression as 2/10.  Affect flat and constricted.  Verbalizes that she is ready to go home.  Asked patient about interest in ECT and patient stated that she did not want to do that.  Attends groups with minimal participation.  No initiation of conversations.  Support and encouragement offered.  Safety maintained.

## 2017-02-06 NOTE — Progress Notes (Signed)
Pt attended spiritual groups today. She shared about her struggle with depression with the group, but she also mentioned she like sharing things with other people in the unit. Pt was cooperative and was well involved in group activities, which she said, she found to be helpful to her.     02/06/17 1600  Clinical Encounter Type  Visited With Patient;Health care provider  Visit Type Initial;Spiritual support  Referral From Nurse  Consult/Referral To Chaplain  Spiritual Encounters  Spiritual Needs Ritual;Other (Comment)

## 2017-02-06 NOTE — Progress Notes (Signed)
D:Pt denies SI/HI/AVH, affect is flat and constricted, denies pain or discomfort. Patient is pleasant and cooperative, thoughts are organized, coherent and logical with delayed response noted, speech is slow. Patient is guarded with minimal information, patient isolated to her room, not interacting this evening with peers and staff.  A:Pt was offered support and encouragement. Pt was given scheduled medications. Pt was encouraged to attend groups. Q 15 minute checks were done for safety.  R: Patient did not attend evening wrap upgroup. Pt is complaint with evening medication, and she is also receptive to treatment on th unit.15 minutes checks maintained will continue to closely monitor.

## 2017-02-06 NOTE — Discharge Summary (Signed)
Physician Discharge Summary Note  Patient:  Sarah Dixon is an 46 y.o., female MRN:  [id number] DOB:  04/08/1971 Patient phone:  [phone] (home)  Patient address:   [address] [place] [address],  Total Time spent with patient: 30 minutes  Date of Admission:  01/29/2017 Date of Discharge: 02/06/2017  Reason for Admission:  Overdose.  Identifying data. Sarah Dixon is a 46 year old female with a history of bipolar disorder.  Chief complaint. "I was overwhelmed."  History of present illness. Information was obtained from the patient and the chart. The patient came to the emergency room after overdose on lithium. The patient reports that she felt overwhelmed and suffered severe insomnia. She took a whole bottle of lithium pills in order to go to sleep. She seems somewhat confused about her intentions but did not deny that it was suicide attempt. She has been insomniac, more anxious and overwhelmed for about a week. She had some racing thoughts but also experienced low energy and was unable to take care of her family or participate in her regular activities. She has great difficulties describing her symptoms. She had two previous manic episodes that resolved with Lithium treatment. She contacted her  new primary doctor who prescribed Lithium 300 mg twice daily. Unfortunately, tha patient used Lithium in excess, possibly to induce sleep. She denies psychotic symptoms. She had more anxiety in the past week but no panic attacks, PTSD or OCD symptoms. She denies drug or alcohol use.  Past psychiatric history. She had two previous manic episodes 20 and 12 years ago. In 2006 she was hospitalized at 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 Both times Lithium has been helpful and the patient did not take any medications in between episodes. There were no suicide attempts.  Family psychiatric history. Yesterday she reported that her  mother and grandmother had bipolar disorder. Today she corrects it stating that they have depression.   Social history. She is married and lives with her husband and two teenage children. 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   Principal Problem: Bipolar affective disorder, current episode manic [facility]) Discharge Diagnoses: Patient Active Problem List   Diagnosis Date Noted  . Vitamin B12 deficiency [E53.8] 02/06/2017  . Lithium overdose [T56.891A] 01/29/2017  . Bipolar affective disorder, current episode manic (HCC) [F31.9] 01/29/2017  . Abnormal vaginal bleeding [N93.9] 12/13/2010   Past Medical History:  Past Medical History:  Diagnosis Date  . Asthma    exercise induced    Past Surgical History:  Procedure Laterality Date  . CESAREAN SECTION     x 2   Family History: History reviewed. No pertinent family history.  Social History:  History  Alcohol use Not on file     History  Drug Use No    Social History   Social History  . Marital status: Married    Spouse name: N/A  . Number of children: N/A  . Years of education: N/A   Social History Main Topics  . Smoking status: Never Smoker  . Smokeless tobacco: Never Used  . Alcohol use None  . Drug use: No  . Sexual activity: Yes    Birth control/ protection: Surgical   Other Topics Concern  . None   Social History Narrative  . None    Hospital Course:   Sarah Dixon is a 46 year old female with a history of bipolar illness admitted for Lithium overdose.  1. Suicidal  ideation. Resolved. The patient denies any thoughts, intentions, or plans to hurt herself or others she is able to contract for safety. She is forward thinking and optimistic about the future. She is a loving mother, wife and daughter.  2. Mood and psychosis. We started lithium for mood stabilization and increased dose to 600 mg twice daily. Lithium level was 0.69. We tried Geodon briefly but the patient  could not tolerated due to sedation.    3. Insomnia. Resolved.   4. Metabolic syndrome monitoring. Lipid panel, TSH, hemoglobin A1c are normal.  5. EKG. Normal sinus rhythm, QTC 435.  6. Pregnancy test is negative.  7. Head CT scan was negative.  8. RPR and HIV testing were negative.   9. Vitamin B12 deficiency. We started Vit B12 supplementation.  10. Disposition. She was discharged to home with family. She will follow up with Dr. Maryruth Bun for medication management.   Physical Findings: AIMS:  , ,  ,  ,    CIWA:    COWS:     Musculoskeletal: Strength & Muscle Tone: within normal limits Gait & Station: normal Patient leans: N/A  Psychiatric Specialty Exam: Physical Exam  Nursing note and vitals reviewed. Psychiatric: She has a normal mood and affect. Her speech is normal and behavior is normal. Thought content normal. Cognition and memory are normal. She expresses impulsivity.    Review of Systems  Neurological: Negative.   Psychiatric/Behavioral: Negative.   All other systems reviewed and are negative.   Blood pressure 125/85, pulse 94, temperature 98.8 F (37.1 C), temperature source Oral, resp. rate 18, height  (1.676 m), weight 84.4 kg (186 lb), SpO2 96 %.Body mass index is 30.02 kg/m.  General Appearance: Casual  Eye Contact:  Good  Speech:  Clear and Coherent  Volume:  Normal  Mood:  Euthymic  Affect:  Appropriate  Thought Process:  Goal Directed and Descriptions of Associations: Intact  Orientation:  Full (Time, Place, and Person)  Thought Content:  WDL  Suicidal Thoughts:  No  Homicidal Thoughts:  No  Memory:  Immediate;   Fair Recent;   Fair Remote;   Fair  Judgement:  Impaired  Insight:  Shallow  Psychomotor Activity:  Normal  Concentration:  Concentration: Fair and Attention Span: Fair  Recall:  [employer] of Knowledge:  Fair  Language:  Fair  Akathisia:  No  Handed:  Right  AIMS (if indicated):     Assets:  Communication  Skills Desire for Improvement Financial Resources/Insurance Housing Intimacy Physical Health Resilience Social Support Talents/Skills Transportation Vocational/Educational  ADL's:  Intact  Cognition:  WNL  Sleep:  Number of Hours: 8.5     Have you used any form of tobacco in the last 30 days? (Cigarettes, Smokeless Tobacco, Cigars, and/or Pipes): No  Has this patient used any form of tobacco in the last 30 days? (Cigarettes, Smokeless Tobacco, Cigars, and/or Pipes) Yes, No  Blood Alcohol level:  Lab Results  Component Value Date   ETH <10 01/28/2017    Metabolic Disorder Labs:  Lab Results  Component Value Date   HGBA1C 5.3 01/30/2017   MPG 105.41 01/30/2017   No results found for: PROLACTIN Lab Results  Component Value Date   CHOL 169 01/30/2017   TRIG 73 01/30/2017   HDL 50 01/30/2017   CHOLHDL 3.4 01/30/2017   VLDL 15 01/30/2017   LDLCALC 104 (H) 01/30/2017    See Psychiatric Specialty Exam and Suicide Risk Assessment completed by Attending Physician prior to discharge.  Discharge destination:  Home  Is patient on multiple antipsychotic therapies at discharge:  No   Has Patient had three or more failed trials of antipsychotic monotherapy by history:  No  Recommended Plan for Multiple Antipsychotic Therapies: NA  Discharge Instructions    Diet - low sodium heart healthy    Complete by:  As directed    Increase activity slowly    Complete by:  As directed      Allergies as of 02/06/2017      Reactions   Iodine Other (See Comments)   Childhood reaction- throat swelling and itching      Medication List    STOP taking these medications   aspirin-acetaminophen-caffeine 250-250-65 MG tablet Commonly known as:  EXCEDRIN MIGRAINE   ibuprofen 200 MG tablet Commonly known as:  ADVIL,MOTRIN   lithium 300 MG tablet Replaced by:  lithium carbonate 300 MG CR tablet     TAKE these medications     Indication  cyanocobalamin 1000 MCG tablet Take 1  tablet (1,000 mcg total) by mouth daily.  Indication:  Inadequate Vitamin B12   lithium carbonate 300 MG CR tablet Commonly known as:  LITHOBID Take 2 tablets (600 mg total) by mouth every 12 (twelve) hours. Replaces:  lithium 300 MG tablet  Indication:  Manic Phase of Manic-Depression      Follow-up Information    [name], MD. Go on 02/26/2017.   Specialty:  Psychiatry Why:  Please attend your medication appointment on Thursday, 02/26/17, at 9:15am.  Please bring a copy of your hospital discharge paperwork. Contact information: [address] [place] [address] [phone]           Follow-up recommendations:  Activity:  As tolerated. Diet:  Low sodium heart healthy. Other:  Keep follow-up appointments.  Comments:    Signed: Kristine Linea, MD 02/06/2017, 7:25 PM

## 2017-02-06 NOTE — Plan of Care (Signed)
Problem: Coping: Goal: Ability to verbalize feelings will improve Outcome: Progressing Able to verbalize feelings although does not go into great detail.   Problem: Safety: Goal: Ability to disclose and discuss suicidal ideas will improve Outcome: Progressing Denies SI/HI/AVH

## 2017-02-06 NOTE — [facility] Suicide Risk Assessment (Signed)
[facility] Discharge Suicide Risk Assessment   Principal Problem: Bipolar affective disorder, current episode manic [facility]) Discharge Diagnoses:  Patient Active Problem List   Diagnosis Date Noted  . Vitamin B12 deficiency [E53.8] 02/06/2017  . Lithium overdose [T56.891A] 01/29/2017  . Bipolar affective disorder, current episode manic (HCC) [F31.9] 01/29/2017  . Abnormal vaginal bleeding [N93.9] 12/13/2010    Total Time spent with patient: 30 minutes  Musculoskeletal: Strength & Muscle Tone: within normal limits Gait & Station: normal Patient leans: N/A  Psychiatric Specialty Exam: Review of Systems  Neurological: Negative.   Psychiatric/Behavioral: Negative.   All other systems reviewed and are negative.   Blood pressure 125/85, pulse 94, temperature 98.8 F (37.1 C), temperature source Oral, resp. rate 18, height  (1.676 m), weight 84.4 kg (186 lb), SpO2 96 %.Body mass index is 30.02 kg/m.  General Appearance: Casual  Eye Contact::  Good  Speech:  Clear and Coherent409  Volume:  Normal  Mood:  Euthymic  Affect:  Appropriate  Thought Process:  Goal Directed and Descriptions of Associations: Intact  Orientation:  Full (Time, Place, and Person)  Thought Content:  WDL  Suicidal Thoughts:  No  Homicidal Thoughts:  No  Memory:  Immediate;   Fair Recent;   Fair Remote;   Fair  Judgement:  Fair  Insight:  Present  Psychomotor Activity:  Normal  Concentration:  Fair  Recall:  [employer] of Knowledge:Fair  Language: Fair  Akathisia:  No  Handed:  Right  AIMS (if indicated):     Assets:  Communication Skills Desire for Improvement Financial Resources/Insurance Housing Intimacy Physical Health Resilience Social Support Talents/Skills Transportation Vocational/Educational  Sleep:  Number of Hours: 8.5  Cognition: WNL  ADL's:  Intact   Mental Status Per Nursing Assessment::   On Admission:     Demographic Factors:  Caucasian  Loss Factors: NA  Historical  Factors: Family history of mental illness or substance abuse and Impulsivity  Risk Reduction Factors:   Responsible for children under 70 years of age, Sense of responsibility to family, Living with another person, especially a relative and Positive social support  Continued Clinical Symptoms:  Bipolar Disorder:   Mixed State  Cognitive Features That Contribute To Risk:  None    Suicide Risk:  Minimal: No identifiable suicidal ideation.  Patients presenting with no risk factors but with morbid ruminations; may be classified as minimal risk based on the severity of the depressive symptoms  Follow-up Information    [name], MD. Go on 02/26/2017.   Specialty:  Psychiatry Why:  Please attend your medication appointment on Thursday, 02/26/17, at 9:15am.  Please bring a copy of your hospital discharge paperwork. Contact information: [address] [place] [address] [phone]           Plan Of Care/Follow-up recommendations:  Activity:  as tolerated. Diet:  low sodium heart healthy. Other:  keep follow up appointments.  Kristine Linea, MD 02/06/2017, 7:22 PM

## 2017-02-06 NOTE — Plan of Care (Signed)
Problem: Coping: Goal: Ability to verbalize feelings will improve Outcome: Progressing Patient verbalized feelings to staff.    

## 2017-02-06 NOTE — Progress Notes (Signed)
Patient was given a hard copy of her discharge summary and discharge instructions.  Prescriptions were given to patient.  Patient, her Mother and Patient's spouse reviewed information and asked appropriate questions.  Patient was discharged with mother and Spouse at 2101 to home.

## 2017-02-07 NOTE — Progress Notes (Signed)
  [facility] Adult Case Management Discharge Plan :  Will you be returning to the same living situation after discharge:  Yes,  with husband, children At discharge, do you have transportation home?: Yes,  husband Do you have the ability to pay for your medications: Yes,  [employer]  Release of information consent forms completed and in the chart;  Patient's signature needed at discharge.  Patient to Follow up at: Follow-up Information    Sarah Ridgel, MD. Go on 02/26/2017.   Specialty:  Psychiatry Why:  Please attend your medication appointment on Thursday, 02/26/17, at 9:15am.  Please bring a copy of your hospital discharge paperwork. Contact information: [address] [place] [address] [phone]           Next level of care provider has access to [facility] Link:yes  Safety Planning and Suicide Prevention discussed: Yes,  with husband  Have you used any form of tobacco in the last 30 days? (Cigarettes, Smokeless Tobacco, Cigars, and/or Pipes): No  Has patient been referred to the Quitline?: N/A patient is not a smoker  Patient has been referred for addiction treatment: Yes  [name], LCSW 02/07/2017, 10:19 AM

## 2018-06-09 IMAGING — CT CT HEAD W/O CM
3 series · 15 of 46 positions shown, 18 images · non-contrast
Comparison: None.

CLINICAL DATA: 46-year-old female with abnormal speech.

EXAM:
CT HEAD WITHOUT CONTRAST
TECHNIQUE: Contiguous axial images were obtained from the base of the skull
through the vertex without intravenous contrast.

[Series 2: head wo · axial · 0.41mm/px · z∈[+21,+141]mm · 9 of 29 slices shown, 12 images]
[im 3/29  brain]
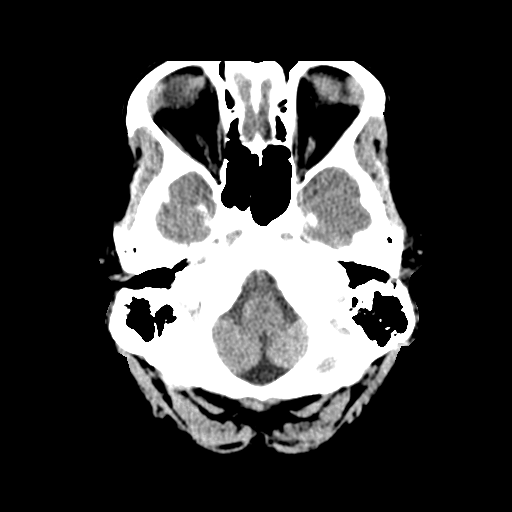
[im 3/29  bone]
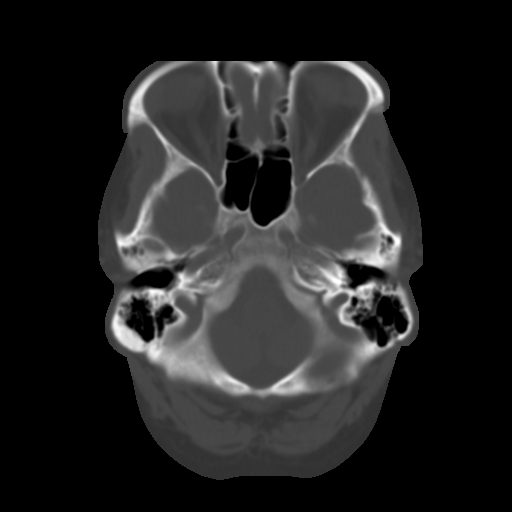
[im 6/29  brain]
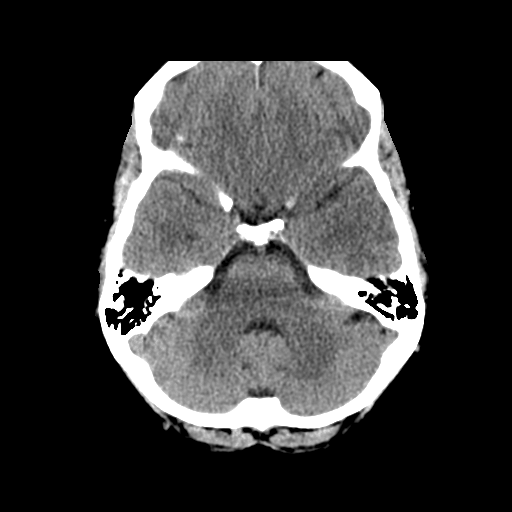
[im 9/29  brain]
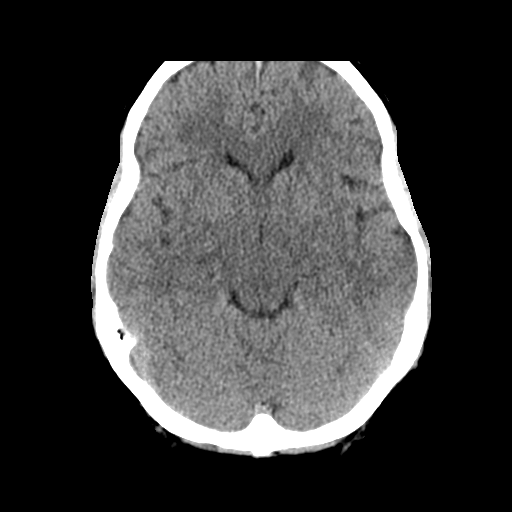
[im 12/29  brain]
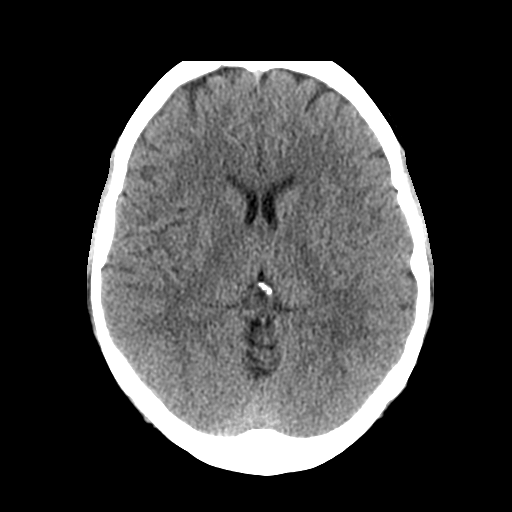
[im 15/29  brain]
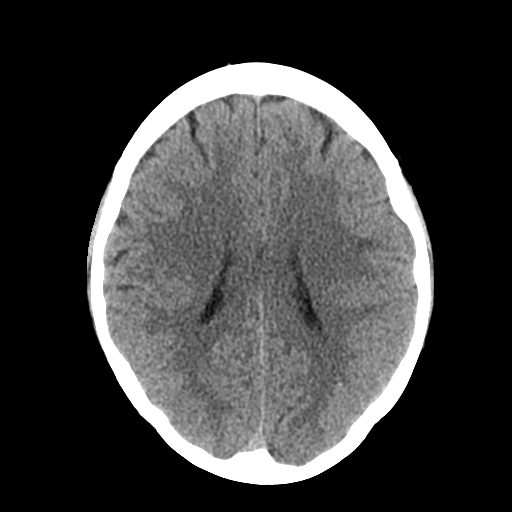
[im 15/29  bone]
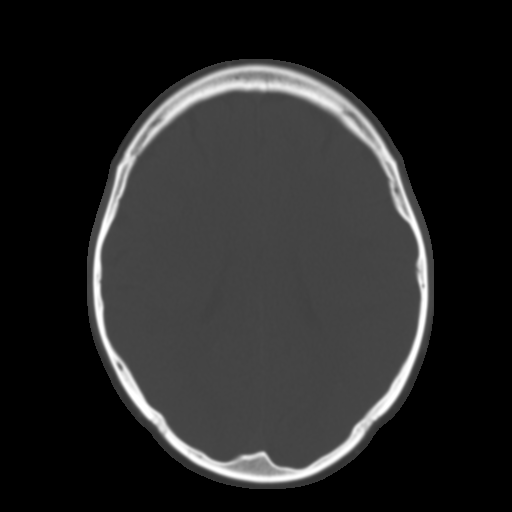
[im 18/29  brain]
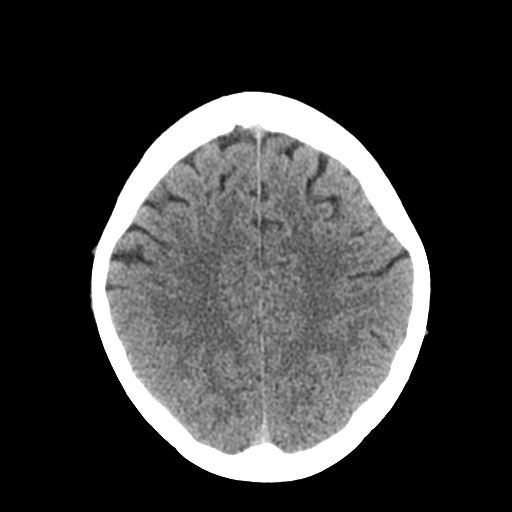
[im 21/29  brain]
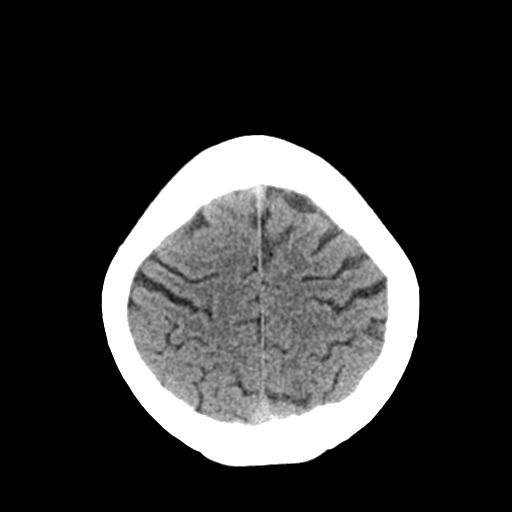
[im 24/29  brain]
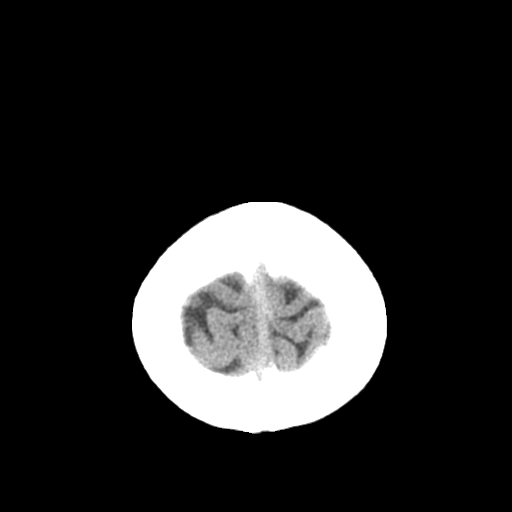
[im 27/29  brain]
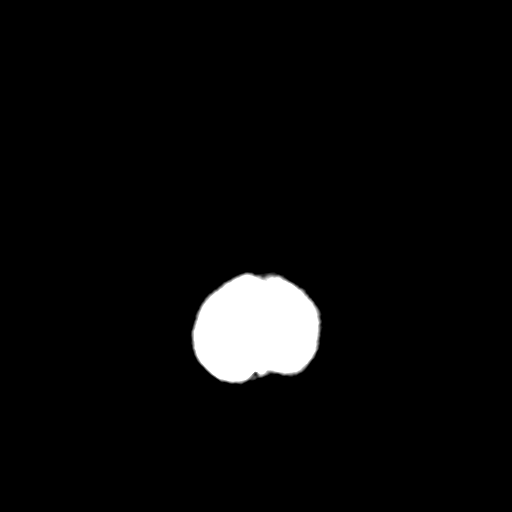
[im 27/29  bone]
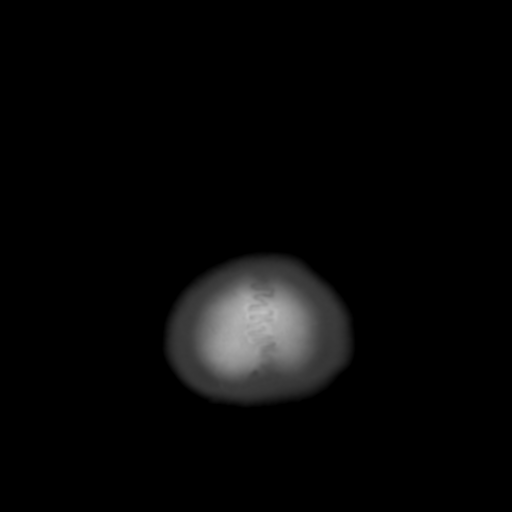

[Series 4: coronal soft tissue · coronal · 0.29mm/px · 3 of 72 slices shown]
[im 24/72  brain]
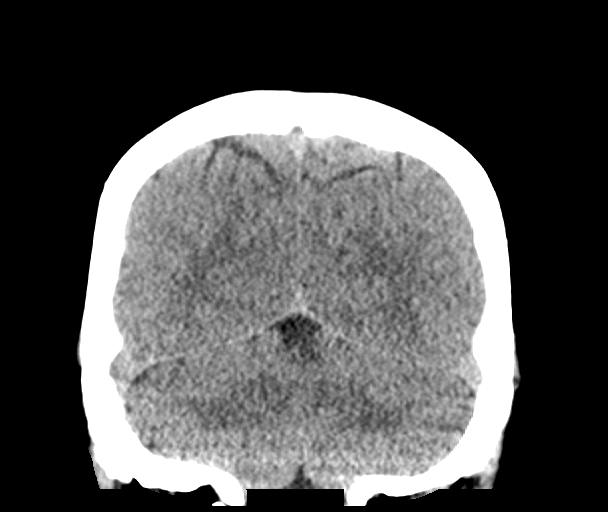
[im 32/72  brain]
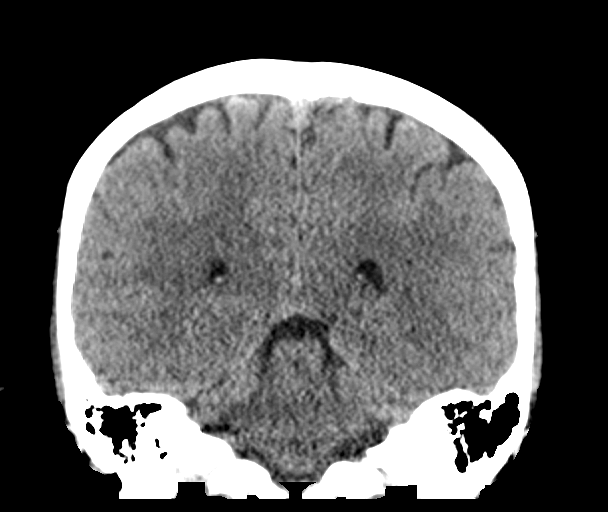
[im 40/72  brain]
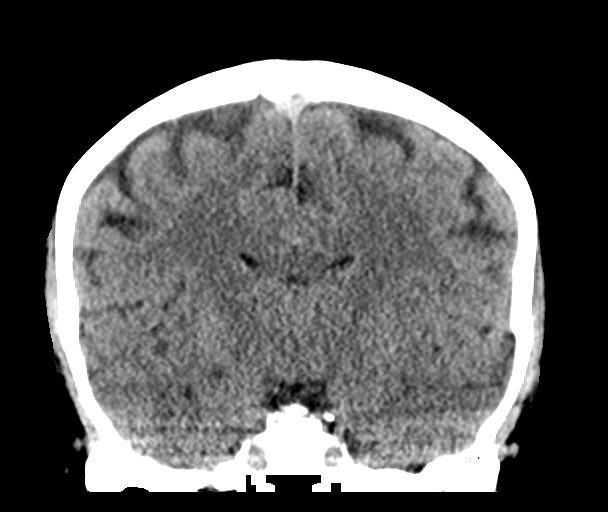

[Series 5: sagittal soft tissue · sagittal · 0.29mm/px · 3 of 63 slices shown]
[im 21/63  brain]
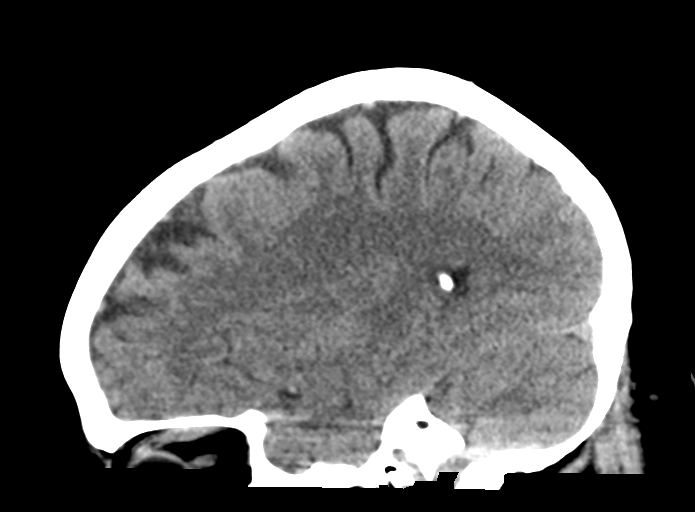
[im 32/63  brain]
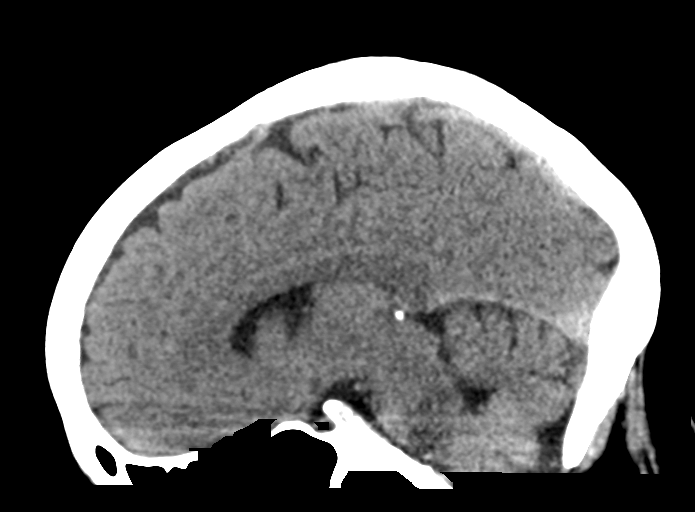
[im 42/63  brain]
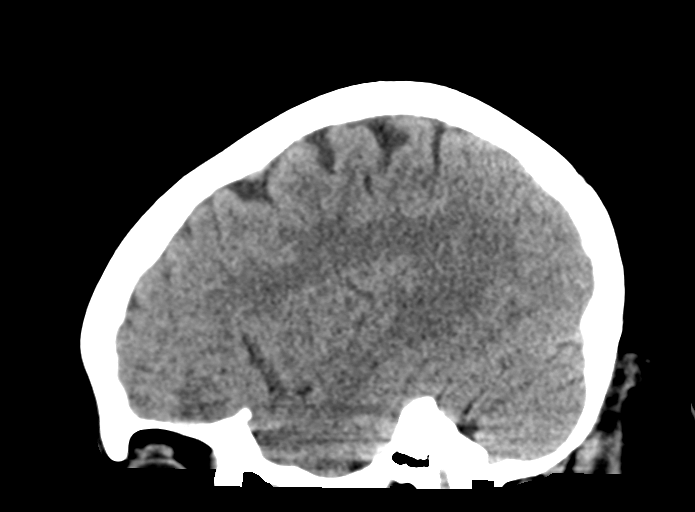

[15 of 46 positions shown; findings below may reference images not displayed]

FINDINGS: Brain: Normal cerebral volume. No midline shift, ventriculomegaly,
mass effect, evidence of mass lesion, intracranial hemorrhage or
evidence of cortically based acute infarction. Gray-white matter
differentiation is within normal limits throughout the brain.

Vascular: No suspicious intracranial vascular hyperdensity.

Skull: Negative.  No acute osseous abnormality identified.

Sinuses/Orbits: Clear.

Other: Visualized orbits and scalp soft tissues are within normal
limits.
IMPRESSION: Normal noncontrast CT appearance of the brain.
